# Patient Record
Sex: Female | Born: 1975 | Race: White | Hispanic: No | Marital: Married | Smoking: Never smoker
Health system: Southern US, Community
[De-identification: ages and names within clinical notes are randomized; demographics above are authoritative.]

## PROBLEM LIST (undated history)

## (undated) DIAGNOSIS — Z302 Encounter for sterilization: Secondary | ICD-10-CM

## (undated) DIAGNOSIS — Z98891 History of uterine scar from previous surgery: Secondary | ICD-10-CM

## (undated) DIAGNOSIS — O9081 Anemia of the puerperium: Secondary | ICD-10-CM

## (undated) DIAGNOSIS — B999 Unspecified infectious disease: Secondary | ICD-10-CM

## (undated) DIAGNOSIS — Z8719 Personal history of other diseases of the digestive system: Secondary | ICD-10-CM

## (undated) DIAGNOSIS — N943 Premenstrual tension syndrome: Secondary | ICD-10-CM

## (undated) DIAGNOSIS — Z8619 Personal history of other infectious and parasitic diseases: Secondary | ICD-10-CM

## (undated) DIAGNOSIS — K56609 Unspecified intestinal obstruction, unspecified as to partial versus complete obstruction: Secondary | ICD-10-CM

## (undated) DIAGNOSIS — E119 Type 2 diabetes mellitus without complications: Secondary | ICD-10-CM

## (undated) DIAGNOSIS — O24419 Gestational diabetes mellitus in pregnancy, unspecified control: Secondary | ICD-10-CM

## (undated) DIAGNOSIS — R51 Headache: Secondary | ICD-10-CM

## (undated) HISTORY — DX: Headache: R51

## (undated) HISTORY — DX: Gestational diabetes mellitus in pregnancy, unspecified control: O24.419

## (undated) HISTORY — DX: Unspecified intestinal obstruction, unspecified as to partial versus complete obstruction: K56.609

## (undated) HISTORY — DX: History of uterine scar from previous surgery: Z98.891

## (undated) HISTORY — DX: Premenstrual tension syndrome: N94.3

## (undated) HISTORY — DX: Type 2 diabetes mellitus without complications: E11.9

## (undated) HISTORY — DX: Personal history of other infectious and parasitic diseases: Z86.19

## (undated) HISTORY — DX: Personal history of other diseases of the digestive system: Z87.19

## (undated) HISTORY — PX: COLOSTOMY: SHX63

## (undated) HISTORY — DX: Unspecified infectious disease: B99.9

## (undated) HISTORY — PX: ABDOMINAL SURGERY: SHX537

## (undated) HISTORY — DX: Anemia of the puerperium: O90.81

## (undated) HISTORY — PX: WISDOM TOOTH EXTRACTION: SHX21

---

## 2004-02-06 ENCOUNTER — Other Ambulatory Visit: Admission: RE | Admit: 2004-02-06 | Discharge: 2004-02-06 | Payer: Self-pay | Admitting: Obstetrics and Gynecology

## 2004-02-06 DIAGNOSIS — Z8719 Personal history of other diseases of the digestive system: Secondary | ICD-10-CM

## 2004-02-06 HISTORY — DX: Personal history of other diseases of the digestive system: Z87.19

## 2005-04-03 ENCOUNTER — Other Ambulatory Visit: Admission: RE | Admit: 2005-04-03 | Discharge: 2005-04-03 | Payer: Self-pay | Admitting: Obstetrics and Gynecology

## 2009-10-30 ENCOUNTER — Encounter (INDEPENDENT_AMBULATORY_CARE_PROVIDER_SITE_OTHER): Payer: Self-pay | Admitting: Obstetrics and Gynecology

## 2009-10-30 ENCOUNTER — Inpatient Hospital Stay (HOSPITAL_COMMUNITY): Admission: AD | Admit: 2009-10-30 | Discharge: 2009-11-02 | Payer: Self-pay | Admitting: Obstetrics and Gynecology

## 2010-03-21 LAB — CBC
HCT: 28.2 % — ABNORMAL LOW (ref 36.0–46.0)
HCT: 36.2 % (ref 36.0–46.0)
Hemoglobin: 12.6 g/dL (ref 12.0–15.0)
Hemoglobin: 9.9 g/dL — ABNORMAL LOW (ref 12.0–15.0)
MCH: 32.5 pg (ref 26.0–34.0)
MCHC: 34.8 g/dL (ref 30.0–36.0)
MCHC: 35.2 g/dL (ref 30.0–36.0)
MCV: 94.4 fL (ref 78.0–100.0)
RDW: 12.9 % (ref 11.5–15.5)

## 2011-01-06 ENCOUNTER — Ambulatory Visit (INDEPENDENT_AMBULATORY_CARE_PROVIDER_SITE_OTHER): Payer: BC Managed Care – PPO

## 2011-01-06 DIAGNOSIS — J45909 Unspecified asthma, uncomplicated: Secondary | ICD-10-CM

## 2011-04-19 ENCOUNTER — Ambulatory Visit (INDEPENDENT_AMBULATORY_CARE_PROVIDER_SITE_OTHER): Payer: BC Managed Care – PPO | Admitting: Family Medicine

## 2011-04-19 VITALS — BP 116/72 | HR 73 | Temp 98.4°F | Resp 16 | Ht 60.5 in | Wt 132.6 lb

## 2011-04-19 DIAGNOSIS — J40 Bronchitis, not specified as acute or chronic: Secondary | ICD-10-CM

## 2011-04-19 DIAGNOSIS — J069 Acute upper respiratory infection, unspecified: Secondary | ICD-10-CM

## 2011-04-19 DIAGNOSIS — J209 Acute bronchitis, unspecified: Secondary | ICD-10-CM

## 2011-04-19 MED ORDER — AZITHROMYCIN 250 MG PO TABS: ORAL_TABLET | ORAL | Status: AC

## 2011-04-19 NOTE — Progress Notes (Signed)
36 yo woman with recurring URI sx for 6 months.  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  Has 22 month old child  O: alert, hoarse, NAD HEENT:  Red post pharynx, TM's ok Neck:  Few anterior cervical nodes Chest:  Few ronchi.  A:  URI, recurrent  P:  z pak

## 2011-04-19 NOTE — Patient Instructions (Signed)

## 2011-04-25 ENCOUNTER — Ambulatory Visit: Payer: Self-pay | Admitting: Obstetrics and Gynecology

## 2011-04-29 ENCOUNTER — Telehealth: Payer: Self-pay | Admitting: Obstetrics and Gynecology

## 2011-04-30 ENCOUNTER — Other Ambulatory Visit (INDEPENDENT_AMBULATORY_CARE_PROVIDER_SITE_OTHER): Payer: BC Managed Care – PPO

## 2011-04-30 DIAGNOSIS — N912 Amenorrhea, unspecified: Secondary | ICD-10-CM

## 2011-04-30 NOTE — Progress Notes (Unsigned)
Patient is 5 weeks 6 days. PNV samples given.

## 2011-05-08 ENCOUNTER — Ambulatory Visit (INDEPENDENT_AMBULATORY_CARE_PROVIDER_SITE_OTHER): Payer: BC Managed Care – PPO | Admitting: Obstetrics and Gynecology

## 2011-05-08 ENCOUNTER — Encounter: Payer: Self-pay | Admitting: Obstetrics and Gynecology

## 2011-05-08 VITALS — BP 104/62 | Ht 59.0 in | Wt 131.0 lb

## 2011-05-08 DIAGNOSIS — Z331 Pregnant state, incidental: Secondary | ICD-10-CM

## 2011-05-08 DIAGNOSIS — B49 Unspecified mycosis: Secondary | ICD-10-CM

## 2011-05-08 DIAGNOSIS — R519 Headache, unspecified: Secondary | ICD-10-CM | POA: Insufficient documentation

## 2011-05-08 DIAGNOSIS — B379 Candidiasis, unspecified: Secondary | ICD-10-CM | POA: Insufficient documentation

## 2011-05-08 DIAGNOSIS — Z8742 Personal history of other diseases of the female genital tract: Secondary | ICD-10-CM

## 2011-05-08 DIAGNOSIS — Z8719 Personal history of other diseases of the digestive system: Secondary | ICD-10-CM

## 2011-05-08 DIAGNOSIS — Z124 Encounter for screening for malignant neoplasm of cervix: Secondary | ICD-10-CM

## 2011-05-08 DIAGNOSIS — Z01419 Encounter for gynecological examination (general) (routine) without abnormal findings: Secondary | ICD-10-CM

## 2011-05-08 DIAGNOSIS — N943 Premenstrual tension syndrome: Secondary | ICD-10-CM

## 2011-05-08 DIAGNOSIS — D649 Anemia, unspecified: Secondary | ICD-10-CM

## 2011-05-08 DIAGNOSIS — Z8619 Personal history of other infectious and parasitic diseases: Secondary | ICD-10-CM

## 2011-05-08 DIAGNOSIS — R51 Headache: Secondary | ICD-10-CM

## 2011-05-08 NOTE — Progress Notes (Addendum)
Last Pap:2011 nl WNL: Yes Regular Periods:no  Monthly Breast exam:yes Tetanus<33yrs:yes Nl.Bladder Function:yes Daily BMs:yes Healthy Diet:yes Calcium:no Mammogram:no Exercise:yes Seatbelt: yes Abuse at home: no Stressful work:yes Sigmoid-colonoscopy: no Bone Density: No  Subjective:    Alison Reed is a 36 y.o. female, G3P1011, who presents for an annual exam. See above. Last menstrual period March 20, 2011.  Currently [redacted] weeks pregnant.  Due date is December 25, 2011.  She is doing well.  Prior Hysterectomy: No    History   Social History  . Marital Status: Single    Spouse Name: N/A    Number of Children: N/A  . Years of Education: N/A   Social History Main Topics  . Smoking status: Never Smoker   . Smokeless tobacco: Never Used  . Alcohol Use: No  . Drug Use: No  . Sexually Active: Yes -- Female partner(s)    Birth Control/ Protection: None   Other Topics Concern  . None   Social History Narrative  . None    Menstrual cycle:   LMP: Patient's last menstrual period was 03/20/2011.           Cycle: pregnant  The following portions of the patient's history were reviewed and updated as appropriate: allergies, current medications, past family history, past medical history, past social history, past surgical history and problem list.  Review of Systems Pertinent items are noted in HPI. Breast:Negative for breast lump,nipple discharge or nipple retraction Gastrointestinal: Negative for abdominal pain, change in bowel habits or rectal bleeding Urinary:negative   Objective:    BP 104/62  Ht 4\' 11"  (1.499 m)  Wt 131 lb (59.421 kg)  BMI 26.46 kg/m2  LMP 03/20/2011    Weight:  Wt Readings from Last 1 Encounters:  05/08/11 131 lb (59.421 kg)          BMI: Body mass index is 26.46 kg/(m^2).  General Appearance: Alert, appropriate appearance for age. No acute distress HEENT: Grossly normal Neck / Thyroid: Supple, no masses, nodes or  enlargement Lungs: clear to auscultation bilaterally Back: No CVA tenderness Breast Exam: No masses or nodes.No dimpling, nipple retraction or discharge. Cardiovascular: Regular rate and rhythm. S1, S2, no murmur Gastrointestinal: Soft, non-tender, no masses or organomegaly  ++++++++++++++++++++++++++++++++++++++++++++++++++++++++  Pelvic Exam: External genitalia: normal general appearance Vaginal: normal without tenderness, induration or masses Cervix: normal appearance Adnexa: normal bimanual exam Uterus: nontender, normal size Rectovaginal: not indicated  ++++++++++++++++++++++++++++++++++++++++++++++++++++++++  Lymphatic Exam: Non-palpable nodes in neck, clavicular, axillary, or inguinal regions Neurologic: Normal speech, no tremor  Psychiatric: Alert and oriented, appropriate affect.   Wet Prep:not applicable Urinalysis:not applicable UPT: Not done   Assessment:    Normal gyn exam   Overweight or obese: Yes   Pelvic relaxation: No  7 weeks' pregnant (due date December 25, 2011)   Plan:    pap smear Return for new OB exam Contraception:pregnant Prenatal vitamins    STD screen request: GC, chlamydia RPR: No.  HBsAg: No. Hepatitis C: No.  The updated Pap smear screening guidelines were discussed with the patient. The patient requested that I obtain a Pap smear: yes.  Kegel exercises discussed: No.  Proper diet and regular exercise were reviewed.  Annual mammograms recommended starting at age 39. Proper breast care was discussed.  Screening colonoscopy is recommended beginning at age 15.  Regular health maintenance was reviewed.  Sleep hygiene was discussed.  Adequate calcium and vitamin D intake was emphasized.  Mylinda Latina.D.

## 2011-05-10 LAB — PAP IG, CT-NG, RFX HPV ASCU
Chlamydia Probe Amp: NEGATIVE
GC Probe Amp: NEGATIVE

## 2011-05-16 ENCOUNTER — Ambulatory Visit (INDEPENDENT_AMBULATORY_CARE_PROVIDER_SITE_OTHER): Payer: BC Managed Care – PPO | Admitting: Physician Assistant

## 2011-05-16 ENCOUNTER — Telehealth: Payer: Self-pay | Admitting: Obstetrics and Gynecology

## 2011-05-16 VITALS — BP 107/72 | HR 69 | Temp 99.0°F | Resp 16 | Ht 60.0 in | Wt 142.0 lb

## 2011-05-16 DIAGNOSIS — J069 Acute upper respiratory infection, unspecified: Secondary | ICD-10-CM

## 2011-05-16 DIAGNOSIS — H9209 Otalgia, unspecified ear: Secondary | ICD-10-CM

## 2011-05-16 DIAGNOSIS — R05 Cough: Secondary | ICD-10-CM

## 2011-05-16 MED ORDER — IPRATROPIUM BROMIDE 0.03 % NA SOLN: 2.0000 | Freq: Two times a day (BID) | NASAL | Status: DC

## 2011-05-16 NOTE — Telephone Encounter (Signed)
Nurse pool sent

## 2011-05-16 NOTE — Patient Instructions (Signed)
Get lots of rest, and drink at least 64 ounces of water daily.  Continue the delsym as needed.  You may use acetaminophen as needed.

## 2011-05-16 NOTE — Progress Notes (Signed)
  Subjective:    Patient ID: Lisabeth Pick, female    DOB: 01-12-1975, 36 y.o.   MRN: [id number]  HPI  "Joni Reining" presents with 3 days of left ear pain and left throat pain.  Laryngitis this morning.  Cough began yesterday.  No fever, chills.  Mild nausea, mild urinary frequency. Back feels achy.  She is [redacted] weeks pregnant with her second child (daughter is 18 months).  Her adoptive father died in 2022-04-13 after a brief battle with lung cancer (he never smoked).  Review of Systems As above.     Objective:   Physical Exam  Vital signs noted. Well-developed, well nourished WF who is awake, alert and oriented, in NAD. HEENT: [place]/AT, PERRL, EOMI.  Sclera and conjunctiva are clear.  EAC are patent, TMs are normal in appearance. Nasal mucosa is pink and moist, a little congested. OP is noted for drainage posteriorly. Neck: supple, non-tender, no lymphadenopathy, thyromegaly. Heart: RRR, no murmur Lungs: CTA Extremities: no cyanosis, clubbing or edema. Skin: warm and dry without rash.      Assessment & Plan:   1. URI (upper respiratory infection)  ipratropium (ATROVENT) 0.03 % nasal spray  2. Otalgia    3. Cough     Supportive care.  Anticipatory guidance.

## 2011-05-22 ENCOUNTER — Telehealth: Payer: Self-pay | Admitting: Obstetrics and Gynecology

## 2011-05-22 NOTE — Telephone Encounter (Signed)
Spoke with pt rgd msg pt preg c/o cough time 1 week advised pt can try plain robitussin, plain mucin ex, plain benadryl pt voice understanding.

## 2011-05-22 NOTE — Telephone Encounter (Signed)
Triage/epic 

## 2011-06-04 ENCOUNTER — Ambulatory Visit (INDEPENDENT_AMBULATORY_CARE_PROVIDER_SITE_OTHER): Payer: BC Managed Care – PPO | Admitting: Obstetrics and Gynecology

## 2011-06-04 ENCOUNTER — Encounter: Payer: Self-pay | Admitting: Obstetrics and Gynecology

## 2011-06-04 VITALS — Ht 59.5 in

## 2011-06-04 VITALS — BP 90/58 | Wt 135.0 lb

## 2011-06-04 DIAGNOSIS — Z9889 Other specified postprocedural states: Secondary | ICD-10-CM

## 2011-06-04 DIAGNOSIS — Z331 Pregnant state, incidental: Secondary | ICD-10-CM

## 2011-06-04 DIAGNOSIS — Z98891 History of uterine scar from previous surgery: Secondary | ICD-10-CM

## 2011-06-04 LAB — POCT URINALYSIS DIPSTICK
Bilirubin, UA: NEGATIVE
Glucose, UA: NEGATIVE
Ketones, UA: NEGATIVE
Nitrite, UA: NEGATIVE
pH, UA: 6

## 2011-06-04 LAB — POCT WET PREP (WET MOUNT)
Bacteria Wet Prep HPF POC: NEGATIVE
WBC, Wet Prep HPF POC: NEGATIVE
pH: 4

## 2011-06-04 NOTE — Patient Instructions (Signed)
Vaginal Birth After Cesarean Delivery Vaginal birth after Cesarean delivery (VBAC) is giving birth vaginally after previously delivering a baby by a cesarean. In the past, if a woman had a Cesarean delivery, all births afterwards would be done by Cesarean delivery. This is no longer true. It can be safe for the mother to try a vaginal delivery after having a Cesarean. The final decision to have a VBAC or repeat Cesarean delivery should be between the patient and her caregiver. The risks and benefits can be discussed relative to the reason for, and the type of the previous Cesarean delivery. WOMEN WHO PLAN TO HAVE A VBAC SHOULD CHECK WITH THEIR DOCTOR TO BE SURE THAT:  The previous Cesarean was done with a low transverse uterine incision (not a vertical classical incision).   The birth canal is big enough for the baby.   There were no other operations on the uterus.   They will have an electronic fetal monitor (EFM) on at all times during labor.   An operating room would be available and ready in case an emergency Cesarean is needed.   A doctor and surgical nursing staff would be available at all times during labor to be ready to do an emergency Cesarean if necessary.   An anesthesiologist would be present in case an emergency Cesarean is needed.   The nursery is prepared and has adequate personnel and necessary equipment available to care for the baby in case of an emergency Cesarean.  BENEFITS OF VBAC:  Shorter stay in the hospital.   Lower delivery, nursery and hospital costs.   Less blood loss and need for blood transfusions.   Less fever and discomfort from major surgery.   Lower risk of blood clots.   Lower risk of infection.   Shorter recovery after going home.   Lower risk of other surgical complications, such as opening of the incision or hernia in the incision.   Decreased risk of injury to other organs.   Decreased risk for having to remove the uterus (hysterectomy).     Decreased risk for the placenta to completely or partially cover the opening of the uterus (placenta previa) with a future pregnancy.   Ability to have a larger family if desired.  RISKS OF A VBAC:  Rupture of the uterus.   Having to remove the uterus (hysterectomy) if it ruptures.   All the complications of major surgery and/or injury to other organs.   Excessive bleeding, blood clots and infection.   Lower Apgar scores (method to evaluate the newborn based on appearance, pulse, grimace, activity, and respiration) and more risks to the baby.   There is a higher risk of uterine rupture if you induce or augment labor.   There is a higher risk of uterine rupture if you use medications to ripen the cervix.  VBAC SHOULD NOT BE DONE IF:  The previous Cesarean was done with a vertical (classical) or T-shaped incision, or you do not know what kind of an incision was made.   You had a ruptured uterus.   You had surgery on your uterus.   You have medical or obstetrical problems.   There are problems with the baby.   There were two previous Cesarean deliveries and no vaginal deliveries.  OTHER FACTS TO KNOW ABOUT VBAC:  It is safe to have an epidural anesthetic with VBAC.   It is safe to turn the baby from a breech position (attempt an external cephalic version).   It is  safe to try a VBAC with twins.   Pregnancies later than 40 weeks have not been successful with VBAC.   There is an increased failure rate of a VBAC in obese pregnant women.   There is an increased failure rate with VABC if the baby weighs 8.8 pounds (4000 grams) or more.   There is an increased failure rate if the time between the Cesarean and VBAC is less than 19 months.   There is an increased failure rate if pre-eclampsia is present (high blood pressure, protein in the urine and swelling of face and extremities).   VBAC is very successful if there was a previous vaginal birth.   VBAC is very successful  when the labor starts spontaneously before the due date.   Delivery of VBAC is similar to having a normal spontaneous vaginal delivery.  It is important to discuss VBAC with your caregiver early in the pregnancy so you can understand the risks, benefits and options. It will give you time to decide what is best in your particular case relevant to the reason for your previous Cesarean delivery. It should be understood that medical changes in the mother or pregnancy may occur during the pregnancy, which make it necessary to change you or your caregiver's initial decision. The counseling, concerns and decisions should be documented in the medical record and signed by all parties. Document Released: 06/16/2006 Document Revised: 12/13/2010 Document Reviewed: 02/05/2008 [facility] Patient Information 2012 [place], [place].ABCs of Pregnancy A Antepartum care is very important. Be sure you see your doctor and get prenatal care as soon as you think you are pregnant. At this time, you will be tested for infection, genetic abnormalities and potential problems with you and the pregnancy. This is the time to discuss diet, exercise, work, medications, labor, pain medication during labor and the possibility of a cesarean delivery. Ask any questions that may concern you. It is important to see your doctor regularly throughout your pregnancy. Avoid exposure to toxic substances and chemicals - such as cleaning solvents, lead and mercury, some insecticides, and paint. Pregnant women should avoid exposure to paint fumes, and fumes that cause you to feel ill, dizzy or faint. When possible, it is a good idea to have a pre-pregnancy consultation with your caregiver to begin some important recommendations your caregiver suggests such as, taking folic acid, exercising, quitting smoking, avoiding alcoholic beverages, etc. B Breastfeeding is the healthiest choice for both you and your baby. It has many nutritional benefits for the baby  and health benefits for the mother. It also creates a very tight and loving bond between the baby and mother. Talk to your doctor, your family and friends, and your employer about how you choose to feed your baby and how they can support you in your decision. Not all birth defects can be prevented, but a woman can take actions that may increase her chance of having a healthy baby. Many birth defects happen very early in pregnancy, sometimes before a woman even knows she is pregnant. Birth defects or abnormalities of any child in your or the father's family should be discussed with your caregiver. Get a good support bra as your breast size changes. Wear it especially when you exercise and when nursing.  C Celebrate the news of your pregnancy with the your spouse/father and family. Childbirth classes are helpful to take for you and the spouse/father because it helps to understand what happens during the pregnancy, labor and delivery. Cesarean delivery should be discussed with  your doctor so you are prepared for that possibility. The pros and cons of circumcision if it is a boy, should be discussed with your pediatrician. Cigarette smoking during pregnancy can result in low birth weight babies. It has been associated with infertility, miscarriages, tubal pregnancies, infant death (mortality) and poor health (morbidity) in childhood. Additionally, cigarette smoking may cause long-term learning disabilities. If you smoke, you should try to quit before getting pregnant and not smoke during the pregnancy. Secondary smoke may also harm a mother and her developing baby. It is a good idea to ask people to stop smoking around you during your pregnancy and after the baby is born. Extra calcium is necessary when you are pregnant and is found in your prenatal vitamin, in dairy products, green leafy vegetables and in calcium supplements. D A healthy diet according to your current weight and height, along with vitamins and  mineral supplements should be discussed with your caregiver. Domestic abuse or violence should be made known to your doctor right away to get the situation corrected. Drink more water when you exercise to keep hydrated. Discomfort of your back and legs usually develops and progresses from the middle of the second trimester through to delivery of the baby. This is because of the enlarging baby and uterus, which may also affect your balance. Do not take illegal drugs. Illegal drugs can seriously harm the baby and you. Drink extra fluids (water is best) throughout pregnancy to help your body keep up with the increases in your blood volume. Drink at least 6 to 8 glasses of water, fruit juice, or milk each day. A good way to know you are drinking enough fluid is when your urine looks almost like clear water or is very light yellow.  E Eat healthy to get the nutrients you and your unborn baby need. Your meals should include the five basic food groups. Exercise (30 minutes of light to moderate exercise a day) is important and encouraged during pregnancy, if there are no medical problems or problems with the pregnancy. Exercise that causes discomfort or dizziness should be stopped and reported to your caregiver. Emotions during pregnancy can change from being ecstatic to depression and should be understood by you, your partner and your family. F Fetal screening with ultrasound, amniocentesis and monitoring during pregnancy and labor is common and sometimes necessary. Take 400 micrograms of folic acid daily both before, when possible, and during the first few months of pregnancy to reduce the risk of birth defects of the brain and spine. All women who could possibly become pregnant should take a vitamin with folic acid, every day. It is also important to eat a healthy diet with fortified foods (enriched grain products, including cereals, rice, breads, and pastas) and foods with natural sources of folate (orange juice,  green leafy vegetables, beans, peanuts, broccoli, asparagus, peas, and lentils). The father should be involved with all aspects of the pregnancy including, the prenatal care, childbirth classes, labor, delivery, and postpartum time. Fathers may also have emotional concerns about being a father, financial needs, and raising a family. G Genetic testing should be done appropriately. It is important to know your family and the father's history. If there have been problems with pregnancies or birth defects in your family, report these to your doctor. Also, genetic counselors can talk with you about the information you might need in making decisions about having a family. You can call a major medical center in your area for help in finding a  board-certified genetic counselor. Genetic testing and counseling should be done before pregnancy when possible, especially if there is a history of problems in the mother's or father's family. Certain ethnic backgrounds are more at risk for genetic defects. H Get familiar with the hospital where you will be having your baby. Get to know how long it takes to get there, the labor and delivery area, and the hospital procedures. Be sure your medical insurance is accepted there. Get your home ready for the baby including, clothes, the baby's room (when possible), furniture and car seat. Hand washing is important throughout the day, especially after handling raw meat and poultry, changing the baby's diaper or using the bathroom. This can help prevent the spread of many bacteria and viruses that cause infection. Your hair may become dry and thinner, but will return to normal a few weeks after the baby is born. Heartburn is a common problem that can be treated by taking antacids recommended by your caregiver, eating smaller meals 5 or 6 times a day, not drinking liquids when eating, drinking between meals and raising the head of your bed 2 to 3 inches. I Insurance to cover you, the  baby, doctor and hospital should be reviewed so that you will be prepared to pay any costs not covered by your insurance plan. If you do not have medical insurance, there are usually clinics and services available for you in your community. Take 30 milligrams of iron during your pregnancy as prescribed by your doctor to reduce the risk of low red blood cells (anemia) later in pregnancy. All women of childbearing age should eat a diet rich in iron. J There should be a joint effort for the mother, father and any other children to adapt to the pregnancy financially, emotionally, and psychologically during the pregnancy. Join a support group for moms-to-be. Or, join a class on parenting or childbirth. Have the family participate when possible. K Know your limits. Let your caregiver know if you experience any of the following:   Pain of any kind.   Strong cramps.   You develop a lot of weight in a short period of time (5 pounds in 3 to 5 days).   Vaginal bleeding, leaking of amniotic fluid.   Headache, vision problems.   Dizziness, fainting, shortness of breath.   Chest pain.   Fever of 102 F (38.9 C) or higher.   Gush of clear fluid from your vagina.   Painful urination.   Domestic violence.   Irregular heartbeat (palpitations).   Rapid beating of the heart (tachycardia).   Constant feeling sick to your stomach (nauseous) and vomiting.   Trouble walking, fluid retention (edema).   Muscle weakness.   If your baby has decreased activity.   Persistent diarrhea.   Abnormal vaginal discharge.   Uterine contractions at 20-minute intervals.   Back pain that travels down your leg.  L Learn and practice that what you eat and drink should be in moderation and healthy for you and your baby. Legal drugs such as alcohol and caffeine are important issues for pregnant women. There is no safe amount of alcohol a woman can drink while pregnant. Fetal alcohol syndrome, a disorder  characterized by growth retardation, facial abnormalities, and central nervous system dysfunction, is caused by a woman's use of alcohol during pregnancy. Caffeine, found in tea, coffee, soft drinks and chocolate, should also be limited. Be sure to read labels when trying to cut down on caffeine during pregnancy. More than 200  foods, beverages, and over-the-counter medications contain caffeine and have a high salt content! There are coffees and teas that do not contain caffeine. M Medical conditions such as diabetes, epilepsy, and high blood pressure should be treated and kept under control before pregnancy when possible, but especially during pregnancy. Ask your caregiver about any medications that may need to be changed or adjusted during pregnancy. If you are currently taking any medications, ask your caregiver if it is safe to take them while you are pregnant or before getting pregnant when possible. Also, be sure to discuss any herbs or vitamins you are taking. They are medicines, too! Discuss with your doctor all medications, prescribed and over-the-counter, that you are taking. During your prenatal visit, discuss the medications your doctor may give you during labor and delivery. N Never be afraid to ask your doctor or caregiver questions about your health, the progress of the pregnancy, family problems, stressful situations, and recommendation for a pediatrician, if you do not have one. It is better to take all precautions and discuss any questions or concerns you may have during your office visits. It is a good idea to write down your questions before you visit the doctor. O Over-the-counter cough and cold remedies may contain alcohol or other ingredients that should be avoided during pregnancy. Ask your caregiver about prescription, herbs or over-the-counter medications that you are taking or may consider taking while pregnant.  P Physical activity during pregnancy can benefit both you and your  baby by lessening discomfort and fatigue, providing a sense of well-being, and increasing the likelihood of early recovery after delivery. Light to moderate exercise during pregnancy strengthens the belly (abdominal) and back muscles. This helps improve posture. Practicing yoga, walking, swimming, and cycling on a stationary bicycle are usually safe exercises for pregnant women. Avoid scuba diving, exercise at high altitudes (over 3000 feet), skiing, horseback riding, contact sports, etc. Always check with your doctor before beginning any kind of exercise, especially during pregnancy and especially if you did not exercise before getting pregnant. Q Queasiness, stomach upset and morning sickness are common during pregnancy. Eating a couple of crackers or dry toast before getting out of bed. Foods that you normally love may make you feel sick to your stomach. You may need to substitute other nutritious foods. Eating 5 or 6 small meals a day instead of 3 large ones may make you feel better. Do not drink with your meals, drink between meals. Questions that you have should be written down and asked during your prenatal visits. R Read about and make plans to baby-proof your home. There are important tips for making your home a safer environment for your baby. Review the tips and make your home safer for you and your baby. Read food labels regarding calories, salt and fat content in the food. S Saunas, hot tubs, and steam rooms should be avoided while you are pregnant. Excessive high heat may be harmful during your pregnancy. Your caregiver will screen and examine you for sexually transmitted diseases and genetic disorders during your prenatal visits. Learn the signs of labor. Sexual relations while pregnant is safe unless there is a medical or pregnancy problem and your caregiver advises against it. T Traveling long distances should be avoided especially in the third trimester of your pregnancy. If you do have to  travel out of state, be sure to take a copy of your medical records and medical insurance plan with you. You should not travel long distances without seeing  your doctor first. Most airlines will not allow you to travel after 36 weeks of pregnancy. Toxoplasmosis is an infection caused by a parasite that can seriously harm an unborn baby. Avoid eating undercooked meat and handling cat litter. Be sure to wear gloves when gardening. Tingling of the hands and fingers is not unusual and is due to fluid retention. This will go away after the baby is born. U Womb (uterus) size increases during the first trimester. Your kidneys will begin to function more efficiently. This may cause you to feel the need to urinate more often. You may also leak urine when sneezing, coughing or laughing. This is due to the growing uterus pressing against your bladder, which lies directly in front of and slightly under the uterus during the first few months of pregnancy. If you experience burning along with frequency of urination or bloody urine, be sure to tell your doctor. The size of your uterus in the third trimester may cause a problem with your balance. It is advisable to maintain good posture and avoid wearing high heels during this time. An ultrasound of your baby may be necessary during your pregnancy and is safe for you and your baby. V Vaccinations are an important concern for pregnant women. Get needed vaccines before pregnancy. Center for Disease Control ([employer]) has clear guidelines for the use of vaccines during pregnancy. Review the list, be sure to discuss it with your doctor. Prenatal vitamins are helpful and healthy for you and the baby. Do not take extra vitamins except what is recommended. Taking too much of certain vitamins can cause overdose problems. Continuous vomiting should be reported to your caregiver. Varicose veins may appear especially if there is a family history of varicose veins. They should subside  after the delivery of the baby. Support hose helps if there is leg discomfort. W Being overweight or underweight during pregnancy may cause problems. Try to get within 15 pounds of your ideal weight before pregnancy. Remember, pregnancy is not a time to be dieting! Do not stop eating or start skipping meals as your weight increases. Both you and your baby need the calories and nutrition you receive from a healthy diet. Be sure to consult with your doctor about your diet. There is a formula and diet plan available depending on whether you are overweight or underweight. Your caregiver or nutritionist can help and advise you if necessary. X Avoid X-rays. If you must have dental work or diagnostic tests, tell your dentist or physician that you are pregnant so that extra care can be taken. X-rays should only be taken when the risks of not taking them outweigh the risk of taking them. If needed, only the minimum amount of radiation should be used. When X-rays are necessary, protective lead shields should be used to cover areas of the body that are not being X-rayed. Y Your baby loves you. Breastfeeding your baby creates a loving and very close bond between the two of you. Give your baby a healthy environment to live in while you are pregnant. Infants and children require constant care and guidance. Their health and safety should be carefully watched at all times. After the baby is born, rest or take a nap when the baby is sleeping. Z Get your ZZZs. Be sure to get plenty of rest. Resting on your side as often as possible, especially on your left side is advised. It provides the best circulation to your baby and helps reduce swelling. Try taking a nap for  30 to 45 minutes in the afternoon when possible. After the baby is born rest or take a nap when the baby is sleeping. Try elevating your feet for that amount of time when possible. It helps the circulation in your legs and helps reduce swelling.  Most information  courtesy of the CDC. Document Released: 12/24/2004 Document Revised: 12/13/2010 Document Reviewed: 09/07/2008 [facility] Patient Information 2012 [place], [place].

## 2011-06-04 NOTE — Progress Notes (Signed)
Pap & GC/CT done 05/08/2011 Pt wants genetic screenings

## 2011-06-05 LAB — PRENATAL PANEL VII
Basophils Absolute: 0 10*3/uL (ref 0.0–0.1)
Basophils Relative: 0 % (ref 0–1)
HCT: 37.6 % (ref 36.0–46.0)
HIV: NONREACTIVE
Hemoglobin: 12.9 g/dL (ref 12.0–15.0)
Hepatitis B Surface Ag: NEGATIVE
Lymphocytes Relative: 21 % (ref 12–46)
Monocytes Absolute: 0.7 10*3/uL (ref 0.1–1.0)
Neutro Abs: 5.6 10*3/uL (ref 1.7–7.7)
Neutrophils Relative %: 71 % (ref 43–77)
RDW: 13.9 % (ref 11.5–15.5)
Rh Type: POSITIVE
WBC: 8 10*3/uL (ref 4.0–10.5)

## 2011-06-06 LAB — CULTURE, OB URINE

## 2011-06-10 DIAGNOSIS — Z98891 History of uterine scar from previous surgery: Secondary | ICD-10-CM | POA: Insufficient documentation

## 2011-06-10 HISTORY — DX: History of uterine scar from previous surgery: Z98.891

## 2011-06-13 NOTE — Progress Notes (Signed)
  Subjective:    Alison Reed is being seen today for her first obstetrical visit.  This is a planned pregnancy. She is at [redacted]w[redacted]d gestation. Her obstetrical history is significant for hx prev c/s at 36wks. Relationship with FOB: spouse, living together. Patient does intend to breast feed. Pregnancy history fully reviewed.  Patient reports no complaints.  Review of Systems:   Review of Systems  All other systems reviewed and are negative.    Objective:     BP 90/58  Wt 135 lb (61.236 kg)  LMP 03/20/2011  Breastfeeding? Unknown Physical Exam  Nursing note and vitals reviewed. Constitutional: She is oriented to person, place, and time. She appears well-developed and well-nourished.  HENT:  Head: Normocephalic and atraumatic.  Eyes: Pupils are equal, round, and reactive to light.  Neck: Normal range of motion. Neck supple. No thyromegaly present.  Cardiovascular: Normal rate, regular rhythm and normal heart sounds.   Respiratory: Effort normal and breath sounds normal.  GI: Soft. Bowel sounds are normal. She exhibits no distension.  Genitourinary: Vagina normal. No vaginal discharge found.       cx - cl/th/high approx 10wk size  Musculoskeletal: Normal range of motion. She exhibits no edema.  Neurological: She is alert and oriented to person, place, and time.  Skin: Skin is warm and dry.  Psychiatric: She has a normal mood and affect. Her behavior is normal.    Maternal Exam:  Abdomen: Patient reports no abdominal tenderness. Fundal height is approx 10-11wks.    Introitus: Normal vulva. Normal vagina.  Vagina is negative for discharge.  Pelvis: adequate for delivery.   Cervix: Cervix evaluated by sterile speculum exam and digital exam.     Fetal Exam Fetal Monitor Review: Mode: hand-held doppler probe.   Baseline rate: 181.         Assessment:    Pregnancy: [id number] Patient Active Problem List  Diagnoses  . Anemia  . History of rectal bleeding  . Hx of  dysmenorrhea  . Pregnant state, incidental  . History of cesarean delivery - 36wks, in labor, breech - w [facility]    rv'd VBAC, pt may want to plan repeat C/S.    Plan:     Initial labs drawn. And rv'd, bl type O pos, urine cx neg Prenatal vitamins. Problem list reviewed and updated. Pt desires genetic screens, will plan 1st trimester in 1-2wks and AFP at [facility] Pap/GC/CT  was normal in May Wet prep neg today RTO 1-2 wks for 1st trim screen and 4wks for ROB  Hildred Pharo M 06/13/2011

## 2011-06-17 ENCOUNTER — Encounter: Payer: Self-pay | Admitting: Obstetrics and Gynecology

## 2011-06-18 ENCOUNTER — Other Ambulatory Visit: Payer: BC Managed Care – PPO

## 2011-06-18 ENCOUNTER — Ambulatory Visit (INDEPENDENT_AMBULATORY_CARE_PROVIDER_SITE_OTHER): Payer: BC Managed Care – PPO

## 2011-06-18 DIAGNOSIS — Z3689 Encounter for other specified antenatal screening: Secondary | ICD-10-CM

## 2011-06-18 DIAGNOSIS — Z36 Encounter for antenatal screening of mother: Secondary | ICD-10-CM

## 2011-06-18 DIAGNOSIS — Z331 Pregnant state, incidental: Secondary | ICD-10-CM

## 2011-06-25 ENCOUNTER — Other Ambulatory Visit: Payer: Self-pay | Admitting: Obstetrics and Gynecology

## 2011-06-25 DIAGNOSIS — Z331 Pregnant state, incidental: Secondary | ICD-10-CM

## 2011-06-25 LAB — US OB COMP LESS 14 WKS

## 2011-07-02 ENCOUNTER — Ambulatory Visit (INDEPENDENT_AMBULATORY_CARE_PROVIDER_SITE_OTHER): Payer: BC Managed Care – PPO | Admitting: Obstetrics and Gynecology

## 2011-07-02 VITALS — BP 96/60 | Wt 139.0 lb

## 2011-07-02 DIAGNOSIS — Z331 Pregnant state, incidental: Secondary | ICD-10-CM

## 2011-07-02 DIAGNOSIS — Z3689 Encounter for other specified antenatal screening: Secondary | ICD-10-CM

## 2011-07-02 NOTE — Progress Notes (Signed)
Doing well. Patient will learn to sleep on her side. Ultrasound of anatomy next visit. Alpha-fetoprotein screen next visit. Return office in 4 weeks. Dr. Stefano Gaul

## 2011-07-02 NOTE — Progress Notes (Signed)
Pt has a concern about 36yr old kicking her belly,  pt states sometimes she sleeps on her belly.

## 2011-07-30 ENCOUNTER — Encounter: Payer: Self-pay | Admitting: Obstetrics and Gynecology

## 2011-07-30 ENCOUNTER — Ambulatory Visit (INDEPENDENT_AMBULATORY_CARE_PROVIDER_SITE_OTHER): Payer: BC Managed Care – PPO

## 2011-07-30 ENCOUNTER — Ambulatory Visit (INDEPENDENT_AMBULATORY_CARE_PROVIDER_SITE_OTHER): Payer: BC Managed Care – PPO | Admitting: Obstetrics and Gynecology

## 2011-07-30 VITALS — BP 104/62 | Wt 141.0 lb

## 2011-07-30 DIAGNOSIS — Z98891 History of uterine scar from previous surgery: Secondary | ICD-10-CM

## 2011-07-30 DIAGNOSIS — Z331 Pregnant state, incidental: Secondary | ICD-10-CM

## 2011-07-30 DIAGNOSIS — Z3689 Encounter for other specified antenatal screening: Secondary | ICD-10-CM

## 2011-07-30 DIAGNOSIS — Z9889 Other specified postprocedural states: Secondary | ICD-10-CM

## 2011-07-30 NOTE — Progress Notes (Signed)
AFP today. No problems.  Sono:   EFW: 8oz +/-0.67oz  AUA 54th%tile   CX: 3.29cm   Vertex presentation  Posterior placenta.  Normal fluid:  AP pocket = 4cm   Growth/EFW = 27th%tile by LMP date  By first u/s measurements, GA = [redacted]w[redacted]d.  See 1st tri scrieen report. todays measurements are c/w   First u/s . ? Need to change EDD.    No fetal abnormality is seen.  Fetal spine not seen due to fetal position. Suggest f/u in 2 weeks to   Clear and evaluate linear growth.  Open hands, 5th digit seen.  Female gender.   CX closed. Normal adnexa. EDC: Changed to be consistent with 1st trimester screen ultrasound and today's ultrasound  01/03/12 VBAC consent given

## 2011-07-31 LAB — ALPHA FETOPROTEIN, MATERNAL
AFP: 42.4 IU/mL
Curr Gest Age: 18.6 wks.days
MoM for AFP: 0.97

## 2011-08-01 LAB — US OB COMP + 14 WK

## 2011-08-27 ENCOUNTER — Ambulatory Visit (INDEPENDENT_AMBULATORY_CARE_PROVIDER_SITE_OTHER): Payer: BC Managed Care – PPO | Admitting: Obstetrics and Gynecology

## 2011-08-27 ENCOUNTER — Ambulatory Visit (INDEPENDENT_AMBULATORY_CARE_PROVIDER_SITE_OTHER): Payer: BC Managed Care – PPO

## 2011-08-27 ENCOUNTER — Encounter: Payer: Self-pay | Admitting: Obstetrics and Gynecology

## 2011-08-27 ENCOUNTER — Other Ambulatory Visit: Payer: Self-pay | Admitting: Obstetrics and Gynecology

## 2011-08-27 VITALS — BP 90/60 | Wt 144.0 lb

## 2011-08-27 DIAGNOSIS — Z3490 Encounter for supervision of normal pregnancy, unspecified, unspecified trimester: Secondary | ICD-10-CM

## 2011-08-27 DIAGNOSIS — Z331 Pregnant state, incidental: Secondary | ICD-10-CM

## 2011-08-27 DIAGNOSIS — O358XX0 Maternal care for other (suspected) fetal abnormality and damage, not applicable or unspecified: Secondary | ICD-10-CM

## 2011-08-27 LAB — US OB FOLLOW UP

## 2011-08-27 NOTE — Progress Notes (Signed)
Doing well, except for heart burn. Comfort measures and OTC meds reviewed. [place] today--completion of spine anatomy, cervix 4.12.  Normal fluid and growth.  Breech, posterior placenta. Glucola at 28-29 weeks. Open to VBAC, but if C/S needed, patient has no concerns about that.

## 2011-08-27 NOTE — Progress Notes (Signed)
C/o frequent heart burns  07/30/11 AFP WNL

## 2011-09-24 ENCOUNTER — Ambulatory Visit (INDEPENDENT_AMBULATORY_CARE_PROVIDER_SITE_OTHER): Payer: BC Managed Care – PPO | Admitting: Obstetrics and Gynecology

## 2011-09-24 ENCOUNTER — Encounter: Payer: Self-pay | Admitting: Obstetrics and Gynecology

## 2011-09-24 VITALS — BP 104/62 | Wt 147.0 lb

## 2011-09-24 DIAGNOSIS — Z331 Pregnant state, incidental: Secondary | ICD-10-CM

## 2011-09-24 NOTE — Progress Notes (Signed)
Pt stated no issues today.  

## 2011-09-24 NOTE — Progress Notes (Signed)
[redacted]w[redacted]d   GFM Previous cesarean for Breech: desires VBAC. Consent given VBAC calculator: 67 % success

## 2011-10-08 ENCOUNTER — Ambulatory Visit (INDEPENDENT_AMBULATORY_CARE_PROVIDER_SITE_OTHER): Payer: BC Managed Care – PPO | Admitting: Obstetrics and Gynecology

## 2011-10-08 ENCOUNTER — Encounter: Payer: Self-pay | Admitting: Obstetrics and Gynecology

## 2011-10-08 ENCOUNTER — Other Ambulatory Visit: Payer: BC Managed Care – PPO

## 2011-10-08 VITALS — BP 90/60 | Wt 148.0 lb

## 2011-10-08 DIAGNOSIS — Z331 Pregnant state, incidental: Secondary | ICD-10-CM

## 2011-10-08 LAB — HEMOGLOBIN: Hemoglobin: 12 g/dL (ref 12.0–15.0)

## 2011-10-08 MED ORDER — PANTOPRAZOLE SODIUM 20 MG PO TBEC: 20.0000 mg | DELAYED_RELEASE_TABLET | Freq: Every day | ORAL | Status: DC

## 2011-10-08 NOTE — Progress Notes (Signed)
Patient ID: Alison Reed, female   DOB: 30-Oct-1975, 36 y.o.   MRN: [id number] [redacted]w[redacted]d 1 gtt today O pos Reviewed s/s preterm labor, srom, vag bleeding,daily kick counts to report, encouraged 8 water daily and frequent voids. Lavera Guise, CNM

## 2011-10-08 NOTE — Addendum Note (Signed)
Addended by: Loralyn Freshwater on: 10/08/2011 03:17 PM   Modules accepted: Orders

## 2011-10-08 NOTE — Progress Notes (Signed)
Glucola given today, pt states that pepcid complete is no longer helping.

## 2011-10-09 ENCOUNTER — Other Ambulatory Visit: Payer: Self-pay | Admitting: Obstetrics and Gynecology

## 2011-10-09 ENCOUNTER — Telehealth: Payer: Self-pay | Admitting: Obstetrics and Gynecology

## 2011-10-09 DIAGNOSIS — O24419 Gestational diabetes mellitus in pregnancy, unspecified control: Secondary | ICD-10-CM

## 2011-10-09 DIAGNOSIS — O99810 Abnormal glucose complicating pregnancy: Secondary | ICD-10-CM

## 2011-10-09 LAB — GLUCOSE TOLERANCE, 1 HOUR (50G) W/O FASTING: Glucose, 1 Hour GTT: 151 mg/dL — ABNORMAL HIGH (ref 70–140)

## 2011-10-09 NOTE — Telephone Encounter (Signed)
Notified pt of elevated 1 hr GTT.  Sched pt 3 hr GTT on 10-18-2011 @ 8:00.  Mailed pt diet and appt info.

## 2011-10-19 LAB — GLUCOSE TOLERANCE, 3 HOURS
Glucose Tolerance, 1 hour: 174 mg/dL (ref 70–189)
Glucose Tolerance, Fasting: 80 mg/dL (ref 70–104)
Glucose, GTT - 3 Hour: 155 mg/dL — ABNORMAL HIGH (ref 70–144)

## 2011-10-21 DIAGNOSIS — O24419 Gestational diabetes mellitus in pregnancy, unspecified control: Secondary | ICD-10-CM | POA: Insufficient documentation

## 2011-10-21 HISTORY — DX: Gestational diabetes mellitus in pregnancy, unspecified control: O24.419

## 2011-10-22 ENCOUNTER — Encounter: Payer: Self-pay | Admitting: Obstetrics and Gynecology

## 2011-10-22 ENCOUNTER — Ambulatory Visit (INDEPENDENT_AMBULATORY_CARE_PROVIDER_SITE_OTHER): Payer: BC Managed Care – PPO | Admitting: Obstetrics and Gynecology

## 2011-10-22 ENCOUNTER — Encounter: Payer: BC Managed Care – PPO | Admitting: Obstetrics and Gynecology

## 2011-10-22 VITALS — BP 102/62 | Wt 151.0 lb

## 2011-10-22 DIAGNOSIS — O24419 Gestational diabetes mellitus in pregnancy, unspecified control: Secondary | ICD-10-CM

## 2011-10-22 DIAGNOSIS — O99810 Abnormal glucose complicating pregnancy: Secondary | ICD-10-CM

## 2011-10-22 NOTE — Progress Notes (Addendum)
[redacted]w[redacted]d 3 hour GTT equals 80/174/167/155.  Gestational diabetes. Hemoglobin 12.0.  RPR nonreactive. We'll make an appointment for the patient to see the diabetes counselor and began glucose monitoring. Patient wants a vaginal birth after cesarean section.  If a cesarean section is required, then she wants a bilateral tubal ligation.  Otherwise she will plan a vasectomy. Return to office in 2 weeks. Dr. Stefano Gaul

## 2011-10-22 NOTE — Progress Notes (Signed)
[redacted]w[redacted]d Pt wanting to speak about possible tubal ligation.

## 2011-10-23 ENCOUNTER — Telehealth: Payer: Self-pay | Admitting: Obstetrics and Gynecology

## 2011-10-23 ENCOUNTER — Encounter: Payer: BC Managed Care – PPO | Admitting: Obstetrics and Gynecology

## 2011-10-23 ENCOUNTER — Other Ambulatory Visit: Payer: Self-pay | Admitting: Obstetrics and Gynecology

## 2011-10-23 DIAGNOSIS — O99810 Abnormal glucose complicating pregnancy: Secondary | ICD-10-CM

## 2011-10-23 NOTE — Telephone Encounter (Signed)
TC to [facility].  States have already left message for pt to schedule appt.

## 2011-10-23 NOTE — Telephone Encounter (Signed)
Message copied by Mason Jim on Wed Oct 23, 2011  9:36 AM ------      Message from: Janine Limbo      Created: Tue Oct 22, 2011  8:12 PM      Regarding: diabetes consult       The patient has gestational diabetes.  Please make an appointment for the patient to see the diabetes counselor and began glucose testing.            Thank you.            Dr. Stefano Gaul

## 2011-10-30 ENCOUNTER — Encounter: Payer: BC Managed Care – PPO | Attending: Obstetrics and Gynecology | Admitting: Dietician

## 2011-10-30 DIAGNOSIS — O99810 Abnormal glucose complicating pregnancy: Secondary | ICD-10-CM | POA: Insufficient documentation

## 2011-10-30 DIAGNOSIS — Z713 Dietary counseling and surveillance: Secondary | ICD-10-CM | POA: Insufficient documentation

## 2011-10-30 DIAGNOSIS — O24419 Gestational diabetes mellitus in pregnancy, unspecified control: Secondary | ICD-10-CM

## 2011-10-31 ENCOUNTER — Telehealth: Payer: Self-pay | Admitting: Obstetrics and Gynecology

## 2011-10-31 ENCOUNTER — Other Ambulatory Visit: Payer: Self-pay | Admitting: Obstetrics and Gynecology

## 2011-10-31 ENCOUNTER — Encounter: Payer: Self-pay | Admitting: Dietician

## 2011-10-31 NOTE — Progress Notes (Signed)
  Patient was seen on 10/30/2011 for Gestational Diabetes self-management class at the [facility]. The following learning objectives were met by the patient during this course:   States the definition of Gestational Diabetes  States why dietary management is important in controlling blood glucose  Describes the effects each nutrient has on blood glucose levels  Demonstrates ability to create a balanced meal plan  Demonstrates carbohydrate counting   States when to check blood glucose levels  Demonstrates proper blood glucose monitoring techniques  States the effect of stress and exercise on blood glucose levels  States the importance of limiting caffeine and abstaining from alcohol and smoking  Blood glucose monitor given: One Touch Ultra Mini Lot # [id number] X Exp: 02/2012 Blood glucose reading: 73 at 6:20 PM  Patient instructed to monitor glucose levels: FBS: 60 - <90 2 hour: <120  Patient received handouts:  Nutrition Diabetes and Pregnancy  Carbohydrate Counting List  Patient will be seen for follow-up as needed.

## 2011-10-31 NOTE — Telephone Encounter (Signed)
Tc to pt regarding msg.  Pt has One Touch Ultra Mini glucose monitor, pt requests One Touch Ultra glucose strips check CBG's QID #100 Rf thru 12/13, One touch delica lancets check CBG's QID #100 RF thru 12/13, called to [facility] pharmacy [phone]), lm on pharmacy voice mail.  Pt called and made aware rx's called in.

## 2011-11-05 ENCOUNTER — Encounter: Payer: Self-pay | Admitting: Obstetrics and Gynecology

## 2011-11-05 ENCOUNTER — Ambulatory Visit (INDEPENDENT_AMBULATORY_CARE_PROVIDER_SITE_OTHER): Payer: BC Managed Care – PPO | Admitting: Obstetrics and Gynecology

## 2011-11-05 VITALS — BP 102/64 | Wt 152.0 lb

## 2011-11-05 DIAGNOSIS — O99810 Abnormal glucose complicating pregnancy: Secondary | ICD-10-CM

## 2011-11-05 DIAGNOSIS — O24419 Gestational diabetes mellitus in pregnancy, unspecified control: Secondary | ICD-10-CM

## 2011-11-05 DIAGNOSIS — Z23 Encounter for immunization: Secondary | ICD-10-CM

## 2011-11-05 NOTE — Progress Notes (Signed)
Pt doing well.  BS are controlled [place] @ [place] for growth FKC reviewed

## 2011-11-05 NOTE — Patient Instructions (Signed)
Fetal Movement Counts Patient Name: __________________________________________________ Patient Due Date: ____________________ Kick counts is highly recommended in high risk pregnancies, but it is a good idea for every pregnant woman to do. Start counting fetal movements at 28 weeks of the pregnancy. Fetal movements increase after eating a full meal or eating or drinking something sweet (the blood sugar is higher). It is also important to drink plenty of fluids (well hydrated) before doing the count. Lie on your left side because it helps with the circulation or you can sit in a comfortable chair with your arms over your belly (abdomen) with no distractions around you. DOING THE COUNT  Try to do the count the same time of day each time you do it.  Mark the day and time, then see how long it takes for you to feel 10 movements (kicks, flutters, swishes, rolls). You should have at least 10 movements within 2 hours. You will most likely feel 10 movements in much less than 2 hours. If you do not, wait an hour and count again. After a couple of days you will see a pattern.  What you are looking for is a change in the pattern or not enough counts in 2 hours. Is it taking longer in time to reach 10 movements? SEEK MEDICAL CARE IF:  You feel less than 10 counts in 2 hours. Tried twice.  No movement in one hour.  The pattern is changing or taking longer each day to reach 10 counts in 2 hours.  You feel the baby is not moving as it usually does. Date: ____________ Movements: ____________ Start time: ____________ Finish time: ____________  Date: ____________ Movements: ____________ Start time: ____________ Finish time: ____________ Date: ____________ Movements: ____________ Start time: ____________ Finish time: ____________ Date: ____________ Movements: ____________ Start time: ____________ Finish time: ____________ Date: ____________ Movements: ____________ Start time: ____________ Finish time:  ____________ Date: ____________ Movements: ____________ Start time: ____________ Finish time: ____________ Date: ____________ Movements: ____________ Start time: ____________ Finish time: ____________ Date: ____________ Movements: ____________ Start time: ____________ Finish time: ____________  Date: ____________ Movements: ____________ Start time: ____________ Finish time: ____________ Date: ____________ Movements: ____________ Start time: ____________ Finish time: ____________ Date: ____________ Movements: ____________ Start time: ____________ Finish time: ____________ Date: ____________ Movements: ____________ Start time: ____________ Finish time: ____________ Date: ____________ Movements: ____________ Start time: ____________ Finish time: ____________ Date: ____________ Movements: ____________ Start time: ____________ Finish time: ____________ Date: ____________ Movements: ____________ Start time: ____________ Finish time: ____________  Date: ____________ Movements: ____________ Start time: ____________ Finish time: ____________ Date: ____________ Movements: ____________ Start time: ____________ Finish time: ____________ Date: ____________ Movements: ____________ Start time: ____________ Finish time: ____________ Date: ____________ Movements: ____________ Start time: ____________ Finish time: ____________ Date: ____________ Movements: ____________ Start time: ____________ Finish time: ____________ Date: ____________ Movements: ____________ Start time: ____________ Finish time: ____________ Date: ____________ Movements: ____________ Start time: ____________ Finish time: ____________  Date: ____________ Movements: ____________ Start time: ____________ Finish time: ____________ Date: ____________ Movements: ____________ Start time: ____________ Finish time: ____________ Date: ____________ Movements: ____________ Start time: ____________ Finish time: ____________ Date: ____________ Movements:  ____________ Start time: ____________ Finish time: ____________ Date: ____________ Movements: ____________ Start time: ____________ Finish time: ____________ Date: ____________ Movements: ____________ Start time: ____________ Finish time: ____________ Date: ____________ Movements: ____________ Start time: ____________ Finish time: ____________  Date: ____________ Movements: ____________ Start time: ____________ Finish time: ____________ Date: ____________ Movements: ____________ Start time: ____________ Finish time: ____________ Date: ____________ Movements: ____________ Start time: ____________ Finish time: ____________ Date: ____________ Movements:   ____________ Start time: ____________ Finish time: ____________ Date: ____________ Movements: ____________ Start time: ____________ Finish time: ____________ Date: ____________ Movements: ____________ Start time: ____________ Finish time: ____________ Date: ____________ Movements: ____________ Start time: ____________ Finish time: ____________  Date: ____________ Movements: ____________ Start time: ____________ Finish time: ____________ Date: ____________ Movements: ____________ Start time: ____________ Finish time: ____________ Date: ____________ Movements: ____________ Start time: ____________ Finish time: ____________ Date: ____________ Movements: ____________ Start time: ____________ Finish time: ____________ Date: ____________ Movements: ____________ Start time: ____________ Finish time: ____________ Date: ____________ Movements: ____________ Start time: ____________ Finish time: ____________ Date: ____________ Movements: ____________ Start time: ____________ Finish time: ____________  Date: ____________ Movements: ____________ Start time: ____________ Finish time: ____________ Date: ____________ Movements: ____________ Start time: ____________ Finish time: ____________ Date: ____________ Movements: ____________ Start time: ____________ Finish  time: ____________ Date: ____________ Movements: ____________ Start time: ____________ Finish time: ____________ Date: ____________ Movements: ____________ Start time: ____________ Finish time: ____________ Date: ____________ Movements: ____________ Start time: ____________ Finish time: ____________ Date: ____________ Movements: ____________ Start time: ____________ Finish time: ____________  Date: ____________ Movements: ____________ Start time: ____________ Finish time: ____________ Date: ____________ Movements: ____________ Start time: ____________ Finish time: ____________ Date: ____________ Movements: ____________ Start time: ____________ Finish time: ____________ Date: ____________ Movements: ____________ Start time: ____________ Finish time: ____________ Date: ____________ Movements: ____________ Start time: ____________ Finish time: ____________ Date: ____________ Movements: ____________ Start time: ____________ Finish time: ____________ Document Released: 01/23/2006 Document Revised: 03/18/2011 Document Reviewed: 07/26/2008 [employer] Patient Information 2013 [employer].  

## 2011-11-05 NOTE — Progress Notes (Signed)
FBS 66-82 2 hr pp 74-138 Flu shot given today

## 2011-11-12 ENCOUNTER — Ambulatory Visit (INDEPENDENT_AMBULATORY_CARE_PROVIDER_SITE_OTHER): Payer: BC Managed Care – PPO

## 2011-11-12 ENCOUNTER — Encounter: Payer: Self-pay | Admitting: Obstetrics and Gynecology

## 2011-11-12 ENCOUNTER — Ambulatory Visit (INDEPENDENT_AMBULATORY_CARE_PROVIDER_SITE_OTHER): Payer: BC Managed Care – PPO | Admitting: Obstetrics and Gynecology

## 2011-11-12 ENCOUNTER — Other Ambulatory Visit: Payer: Self-pay | Admitting: Obstetrics and Gynecology

## 2011-11-12 VITALS — BP 106/60 | Wt 151.0 lb

## 2011-11-12 DIAGNOSIS — Z98891 History of uterine scar from previous surgery: Secondary | ICD-10-CM

## 2011-11-12 DIAGNOSIS — O24419 Gestational diabetes mellitus in pregnancy, unspecified control: Secondary | ICD-10-CM

## 2011-11-12 DIAGNOSIS — O99810 Abnormal glucose complicating pregnancy: Secondary | ICD-10-CM

## 2011-11-12 DIAGNOSIS — Z9889 Other specified postprocedural states: Secondary | ICD-10-CM

## 2011-11-12 LAB — US OB FOLLOW UP

## 2011-11-12 NOTE — Progress Notes (Signed)
[redacted]w[redacted]d No complaints.  Plans VBAC Ultrasound shows:  Gestational age by [place]     SIUP  S=D     [place] EDD: 01/03/2012            AFI: 11.88 cm            Cervical length: 4.37 cm            Placenta localization: posterior           Fetal presentation: vertex      Anatomy survey is normal Comments: Vertex Presentation, Posterior Placenta, AFI is normal (30th%) CBGs all excellent

## 2011-11-26 ENCOUNTER — Ambulatory Visit (INDEPENDENT_AMBULATORY_CARE_PROVIDER_SITE_OTHER): Payer: BC Managed Care – PPO | Admitting: Obstetrics and Gynecology

## 2011-11-26 ENCOUNTER — Encounter: Payer: Self-pay | Admitting: Obstetrics and Gynecology

## 2011-11-26 VITALS — BP 110/60 | Wt 152.0 lb

## 2011-11-26 DIAGNOSIS — O99810 Abnormal glucose complicating pregnancy: Secondary | ICD-10-CM

## 2011-11-26 DIAGNOSIS — O24419 Gestational diabetes mellitus in pregnancy, unspecified control: Secondary | ICD-10-CM

## 2011-11-26 NOTE — Progress Notes (Signed)
[redacted]w[redacted]d Pt without c/o GDM fasting blood sugars all normal Pp blood sugars occasionally elevated NST reactive repeat on Friday BPP and growth @[facility] 

## 2011-11-29 ENCOUNTER — Other Ambulatory Visit: Payer: BC Managed Care – PPO

## 2011-12-03 ENCOUNTER — Ambulatory Visit (INDEPENDENT_AMBULATORY_CARE_PROVIDER_SITE_OTHER): Payer: BC Managed Care – PPO | Admitting: Obstetrics and Gynecology

## 2011-12-03 ENCOUNTER — Ambulatory Visit (INDEPENDENT_AMBULATORY_CARE_PROVIDER_SITE_OTHER): Payer: BC Managed Care – PPO

## 2011-12-03 ENCOUNTER — Encounter: Payer: Self-pay | Admitting: Obstetrics and Gynecology

## 2011-12-03 ENCOUNTER — Other Ambulatory Visit: Payer: Self-pay | Admitting: Obstetrics and Gynecology

## 2011-12-03 VITALS — BP 90/58 | Wt 153.0 lb

## 2011-12-03 DIAGNOSIS — O24419 Gestational diabetes mellitus in pregnancy, unspecified control: Secondary | ICD-10-CM

## 2011-12-03 DIAGNOSIS — Z331 Pregnant state, incidental: Secondary | ICD-10-CM

## 2011-12-03 DIAGNOSIS — O99810 Abnormal glucose complicating pregnancy: Secondary | ICD-10-CM

## 2011-12-03 NOTE — Progress Notes (Signed)
[redacted]w[redacted]d  C/O: pain at the top of her stomach that is sometimes as "Nubmbness/pain"   GBS today.

## 2011-12-03 NOTE — Progress Notes (Signed)
[redacted]w[redacted]d Ultrasound: Single gestation, vertex, normal fluid, cervix 3.74 cm, 5 lbs. 11 oz. (36 percentile). Beta strep today. Return office in 1 week. Dr. Stefano Gaul

## 2011-12-05 LAB — STREP B DNA PROBE: GBSP: NEGATIVE

## 2011-12-10 ENCOUNTER — Encounter: Payer: Self-pay | Admitting: Obstetrics and Gynecology

## 2011-12-10 ENCOUNTER — Ambulatory Visit (INDEPENDENT_AMBULATORY_CARE_PROVIDER_SITE_OTHER): Payer: BC Managed Care – PPO | Admitting: Obstetrics and Gynecology

## 2011-12-10 VITALS — BP 90/56 | Wt 153.5 lb

## 2011-12-10 DIAGNOSIS — Z331 Pregnant state, incidental: Secondary | ICD-10-CM

## 2011-12-10 DIAGNOSIS — O99810 Abnormal glucose complicating pregnancy: Secondary | ICD-10-CM

## 2011-12-10 DIAGNOSIS — O24419 Gestational diabetes mellitus in pregnancy, unspecified control: Secondary | ICD-10-CM

## 2011-12-10 NOTE — Progress Notes (Signed)
[redacted]w[redacted]d Pt c/o BH's getting more painful

## 2011-12-10 NOTE — Progress Notes (Signed)
[redacted]w[redacted]d GBS neg Doing well rv'd CBG's, most have been normal, had some pc lunch elevations, high of 175 This was over thanksgiving, rv'd diet and increasing exercise  GFM Pt wants VBAC rv'd IOL at 40wks w foley balloon  rv'd w Dr Stefano Gaul, pt needs growth [place] every 4wks, last one at 35wks, will plan to do at 39wks if no delivery (last delivery at 36wks for PPROM, breech) FKC, labor sx's GBS neg

## 2011-12-12 ENCOUNTER — Telehealth: Payer: Self-pay

## 2011-12-12 LAB — US OB FOLLOW UP

## 2011-12-12 NOTE — Telephone Encounter (Signed)
LM for pt to return call. Pt needs u/s appt for growth per SL.

## 2011-12-13 ENCOUNTER — Telehealth: Payer: Self-pay

## 2011-12-13 NOTE — Telephone Encounter (Signed)
Spoke with pt informing her u/s needs to be at 39 wks and Weds 11th she will not be 39 wks yet. Informed pt during visit on Weds, we will schedule u/s for growth then.

## 2011-12-13 NOTE — Telephone Encounter (Signed)
Spoke with pt informing her per SL growth u/s is needed. Appt scheduled 12/18/11 @ 8:30 am and pt will be seen directly after u/s before 10 am. Also informed pt SL is in the process of setting up an induction for her. Pt states she will discuss induction further with HS during Wed. appt. Pt agrees and voices understanding.

## 2011-12-18 ENCOUNTER — Ambulatory Visit (INDEPENDENT_AMBULATORY_CARE_PROVIDER_SITE_OTHER): Payer: BC Managed Care – PPO

## 2011-12-18 ENCOUNTER — Other Ambulatory Visit: Payer: BC Managed Care – PPO

## 2011-12-18 VITALS — BP 100/62 | Wt 153.0 lb

## 2011-12-18 DIAGNOSIS — O24419 Gestational diabetes mellitus in pregnancy, unspecified control: Secondary | ICD-10-CM

## 2011-12-18 DIAGNOSIS — O99810 Abnormal glucose complicating pregnancy: Secondary | ICD-10-CM

## 2011-12-18 NOTE — Progress Notes (Signed)
[redacted]w[redacted]d Pt wants cx checked for comfort  Pt has CBG's today

## 2011-12-18 NOTE — Progress Notes (Signed)
[redacted]w[redacted]d; cbg's WNL except 3 supper's elevated w/ high of those=130.  Still desires VBAC.   Pt ok w/ IOL at 40-40.5 weeks.  Rev'd FKC and labor s/s.  Feels GDM r/t increased stress and father's death last 2022-04-14.

## 2011-12-19 ENCOUNTER — Other Ambulatory Visit: Payer: Self-pay

## 2011-12-19 DIAGNOSIS — O24419 Gestational diabetes mellitus in pregnancy, unspecified control: Secondary | ICD-10-CM

## 2011-12-20 ENCOUNTER — Telehealth (HOSPITAL_COMMUNITY): Payer: Self-pay | Admitting: *Deleted

## 2011-12-20 ENCOUNTER — Telehealth: Payer: Self-pay | Admitting: Obstetrics and Gynecology

## 2011-12-20 ENCOUNTER — Encounter (HOSPITAL_COMMUNITY): Payer: Self-pay | Admitting: *Deleted

## 2011-12-20 NOTE — Telephone Encounter (Signed)
Induction scheduled for 01/03/12 @ 7:30pm with ND/VL. -Adrianne Pridgen

## 2011-12-20 NOTE — Telephone Encounter (Signed)
Preadmission screen  

## 2011-12-25 ENCOUNTER — Encounter: Payer: Self-pay | Admitting: Obstetrics and Gynecology

## 2011-12-25 ENCOUNTER — Ambulatory Visit (INDEPENDENT_AMBULATORY_CARE_PROVIDER_SITE_OTHER): Payer: BC Managed Care – PPO | Admitting: Obstetrics and Gynecology

## 2011-12-25 VITALS — BP 102/70 | Wt 154.0 lb

## 2011-12-25 DIAGNOSIS — O99810 Abnormal glucose complicating pregnancy: Secondary | ICD-10-CM

## 2011-12-25 DIAGNOSIS — O24419 Gestational diabetes mellitus in pregnancy, unspecified control: Secondary | ICD-10-CM

## 2011-12-25 DIAGNOSIS — Z331 Pregnant state, incidental: Secondary | ICD-10-CM

## 2011-12-25 NOTE — Progress Notes (Signed)
[redacted]w[redacted]d Diet controlled GDM - CBGs are good may reduce to checking 2x/day FKCs and Labor Precautions Already sched for induction on the 27th Reviewed slightly increased risk for uterine rupture with IOL and pt verbalized understanding and wants to proceed Pt is scheduled for u/s for EFW next week as well

## 2011-12-25 NOTE — Addendum Note (Signed)
Addended by: Marla Roe A on: 12/25/2011 10:53 AM   Modules accepted: Orders

## 2011-12-31 ENCOUNTER — Ambulatory Visit (INDEPENDENT_AMBULATORY_CARE_PROVIDER_SITE_OTHER): Payer: BC Managed Care – PPO | Admitting: Obstetrics and Gynecology

## 2011-12-31 ENCOUNTER — Other Ambulatory Visit: Payer: Self-pay | Admitting: Obstetrics and Gynecology

## 2011-12-31 ENCOUNTER — Ambulatory Visit (INDEPENDENT_AMBULATORY_CARE_PROVIDER_SITE_OTHER): Payer: BC Managed Care – PPO

## 2011-12-31 ENCOUNTER — Encounter: Payer: Self-pay | Admitting: Obstetrics and Gynecology

## 2011-12-31 VITALS — BP 106/60 | Wt 153.0 lb

## 2011-12-31 DIAGNOSIS — O24419 Gestational diabetes mellitus in pregnancy, unspecified control: Secondary | ICD-10-CM

## 2011-12-31 DIAGNOSIS — O99810 Abnormal glucose complicating pregnancy: Secondary | ICD-10-CM

## 2011-12-31 NOTE — Progress Notes (Signed)
Ultrasound shows:  SIUP  S=D     [place] EDD: 01/03/12           EFW: 7 lbs 3 oz 52%           AFI: 13.5           Cervical length: not measured           Placenta localization: posterior           Fetal presentation: vertex

## 2011-12-31 NOTE — Progress Notes (Signed)
[redacted]w[redacted]d GFM  Scheduled for IOL Friday 01/03/12 pm  Class A1 DB: well controlled all FBS are normal Ultrasound: AGA with BPP 8/8 and normal AFI

## 2011-12-31 NOTE — Progress Notes (Signed)
[redacted]w[redacted]d Pt has no complaints  Pt wants cervix checked  Pt has glucose log

## 2012-01-03 ENCOUNTER — Inpatient Hospital Stay (HOSPITAL_COMMUNITY)
Admission: AD | Admit: 2012-01-03 | Payer: BC Managed Care – PPO | Source: Ambulatory Visit | Admitting: Obstetrics and Gynecology

## 2012-01-03 ENCOUNTER — Encounter (HOSPITAL_COMMUNITY): Payer: Self-pay

## 2012-01-03 ENCOUNTER — Inpatient Hospital Stay (HOSPITAL_COMMUNITY)
Admission: RE | Admit: 2012-01-03 | Discharge: 2012-01-06 | DRG: 651 | Disposition: A | Discharge status: Home / Self Care | Payer: BC Managed Care – PPO | Source: Ambulatory Visit | Attending: Obstetrics and Gynecology | Admitting: Obstetrics and Gynecology

## 2012-01-03 VITALS — BP 97/63 | HR 77 | Temp 98.3°F | Resp 18 | Ht 59.0 in | Wt 153.0 lb

## 2012-01-03 DIAGNOSIS — Z98891 History of uterine scar from previous surgery: Secondary | ICD-10-CM

## 2012-01-03 DIAGNOSIS — O34219 Maternal care for unspecified type scar from previous cesarean delivery: Secondary | ICD-10-CM | POA: Diagnosis present

## 2012-01-03 DIAGNOSIS — Z302 Encounter for sterilization: Secondary | ICD-10-CM

## 2012-01-03 DIAGNOSIS — O9081 Anemia of the puerperium: Secondary | ICD-10-CM | POA: Diagnosis not present

## 2012-01-03 DIAGNOSIS — O99814 Abnormal glucose complicating childbirth: Principal | ICD-10-CM | POA: Diagnosis present

## 2012-01-03 HISTORY — DX: History of uterine scar from previous surgery: Z98.891

## 2012-01-03 HISTORY — DX: Encounter for sterilization: Z30.2

## 2012-01-03 LAB — CBC
MCH: 31 pg (ref 26.0–34.0)
Platelets: 188 10*3/uL (ref 150–400)
RBC: 4.1 MIL/uL (ref 3.87–5.11)

## 2012-01-03 LAB — US OB FOLLOW UP

## 2012-01-03 LAB — TYPE AND SCREEN

## 2012-01-03 MED ORDER — OXYTOCIN BOLUS FROM INFUSION: 500.0000 mL | INTRAVENOUS | Status: DC

## 2012-01-03 MED ORDER — CITRIC ACID-SODIUM CITRATE 334-500 MG/5ML PO SOLN
30.0000 mL | ORAL | Status: DC | PRN
  Filled 2012-01-03: qty 15

## 2012-01-03 MED ORDER — LACTATED RINGERS IV SOLN
500.0000 mL | INTRAVENOUS | Status: DC | PRN
  Administered 2012-01-04: 500 mL via INTRAVENOUS

## 2012-01-03 MED ORDER — TERBUTALINE SULFATE 1 MG/ML IJ SOLN
0.2500 mg | Freq: Once | INTRAMUSCULAR | Status: AC | PRN
  Filled 2012-01-03: qty 1

## 2012-01-03 MED ORDER — OXYTOCIN 40 UNITS IN LACTATED RINGERS INFUSION - SIMPLE MED: 1.0000 m[IU]/min | INTRAVENOUS | Status: DC

## 2012-01-03 MED ORDER — OXYCODONE-ACETAMINOPHEN 5-325 MG PO TABS: 1.0000 | ORAL_TABLET | ORAL | Status: DC | PRN

## 2012-01-03 MED ORDER — LACTATED RINGERS IV SOLN
INTRAVENOUS | Status: DC
  Administered 2012-01-03: 22:00:00 via INTRAVENOUS

## 2012-01-03 MED ORDER — FENTANYL CITRATE 0.05 MG/ML IJ SOLN
100.0000 ug | INTRAMUSCULAR | Status: DC | PRN
  Administered 2012-01-03 – 2012-01-04 (×2): 100 ug via INTRAVENOUS
  Filled 2012-01-03 (×3): qty 2

## 2012-01-03 MED ORDER — ONDANSETRON HCL 4 MG/2ML IJ SOLN: 4.0000 mg | Freq: Four times a day (QID) | INTRAMUSCULAR | Status: DC | PRN

## 2012-01-03 MED ORDER — ACETAMINOPHEN 325 MG PO TABS: 650.0000 mg | ORAL_TABLET | ORAL | Status: DC | PRN

## 2012-01-03 MED ORDER — IBUPROFEN 600 MG PO TABS: 600.0000 mg | ORAL_TABLET | Freq: Four times a day (QID) | ORAL | Status: DC | PRN

## 2012-01-03 MED ORDER — OXYTOCIN 40 UNITS IN LACTATED RINGERS INFUSION - SIMPLE MED: 62.5000 mL/h | INTRAVENOUS | Status: DC

## 2012-01-03 MED ORDER — DIPHENHYDRAMINE HCL 25 MG PO CAPS
25.0000 mg | ORAL_CAPSULE | ORAL | Status: DC | PRN
  Administered 2012-01-03: 25 mg via ORAL
  Filled 2012-01-03: qty 1

## 2012-01-03 MED ORDER — LIDOCAINE HCL (PF) 1 % IJ SOLN
INTRAMUSCULAR | Status: AC
  Filled 2012-01-03: qty 30

## 2012-01-03 MED ORDER — FLEET ENEMA 7-19 GM/118ML RE ENEM: 1.0000 | ENEMA | RECTAL | Status: DC | PRN

## 2012-01-03 MED ORDER — ZOLPIDEM TARTRATE 5 MG PO TABS: 5.0000 mg | ORAL_TABLET | Freq: Every evening | ORAL | Status: DC | PRN

## 2012-01-03 NOTE — Progress Notes (Signed)
Subjective: Pt just up to [facility] to have BM, and foley bulb came out.  Pt feeling some cramping.  Husband at bedside and supportive.  Objective: BP 115/75  Pulse 76  Temp 98.1 F (36.7 C) (Oral)  Resp 20  Ht 4\' 11"  (1.499 m)  Wt 153 lb (69.4 kg)  BMI 30.90 kg/m2  LMP 03/20/2011      FHT:  FHR: 135 bpm, variability: moderate,  accelerations:  Present,  decelerations:  Absent UC:   irregular, every 4-6 minutes SVE:   Dilation: 3.5 Effacement (%): 60 Station: -2 Exam by:: H. Jolie Strohecker, CNM Mod amt of bloody show noted; vtx still high and asynclitic; membranes swept; cervix stretchy Labs: Lab Results  Component Value Date   WBC 9.3 01/03/2012   HGB 12.7 01/03/2012   HCT 35.7* 01/03/2012   MCV 87.1 01/03/2012   PLT 188 01/03/2012    Assessment / Plan: 1. 40 weeks 2. IOL for GDM-diet controlled 3. GBS neg 4. TOLAC  Labor: progressing normally; foley bulb out after 2 hrs Preeclampsia:  no signs or symptoms of toxicity Fetal Wellbeing:  Category I Pain Control:  Labor support without medications I/D:  n/a Anticipated MOD:  NSVD 1. Pt desires Benadryl vs Ambien to help w/ sleep 2.  Will CTO for labor progress overnight; Pitocin prn 3. Pt plans epidural eventually in labor 4. C/w MD prn  Tyliek Timberman H 01/03/2012, 11:13 PM

## 2012-01-03 NOTE — Plan of Care (Signed)
Problem: Consults Goal: Birthing Suites Patient Information Press F2 to bring up selections list Outcome: Completed/Met Date Met:  01/03/12  Inpatient induction

## 2012-01-03 NOTE — H&P (Signed)
Alison Reed is a 36 y.o. female, G2P1001 at 40 weeks, presenting for induction due to gestational diabetes, diet controlled.  Patient has been counseled on the risks of VBAC, including risk of uterine rupture with induction, failure of method, prolonged labor, need for repeat C/S, fetal intolerance to labor, and still wishes to proceed with induction. Denies leaking or bleeding, reports +FM.  Patient Active Problem List  Diagnosis  . Anemia  . History of rectal bleeding  . Hx of dysmenorrhea  . Pregnant state, incidental  . History of cesarean delivery - 36wks, in labor, breech - w [facility]  . GDM (gestational diabetes mellitus)    History of present pregnancy: Patient entered care at 10-11 weeks.   EDC of 01/03/12 was established by early [place].   Anatomy scan:  17 4/7 weeks, with normal findings and an posterior placenta.   Additional [place] evaluations:   32 4/7 weeks, with normal growth and fluid. 35 4/7 weeks, with normal growth and fluid 39 weeks, normal growth (7+3, 52%ile) and fluid   Significant prenatal events:  Normal 1st trimester screen and AFP.  Elevated 1 hour glucola, with abnormal 3 hour GTT = GDM.  Has continued to be diet controlled, with stable CBGs.  Patient desires VBAC, was counseled re:  Increased risk of rupture with induction, and the 67% predictive success rate for VBAC in her case. Last evaluation:  12/31/11, cervix closed, 50%, vtx, -2.  OB History    Grav Para Term Preterm Abortions TAB SAB Ect Mult Living   3 1 0 1 1  1  0  1    2010--SAB at 8 weeks 2011--primary LTCS for breech in labor, female, 5+4, spinal.  Past Medical History  Diagnosis Date  . Yeast infection   . History of chicken pox   . History of rectal bleeding 02/06/04  . Headache     SINUS;ALLERGY RELATED  . Infection     YEAST;NOT FREQ  . PMS (premenstrual syndrome)   . Premature baby     @ 30 WKS;HAD SEVERAL SURGERIES;HAD COLOSTOMY  . Diabetes mellitus without complication   .  Gestational diabetes     diet controlled   Past Surgical History  Procedure Date  . Wisdom tooth extraction   . Colostomy   . Cesarean section 10/30/2009  . Abdominal surgery     infant abdomen reconstruction  . Abdominal surgery     revise scar   Family History: family history includes Cancer in her father; Heart disease in her maternal grandmother; and Thyroid disease in her maternal grandmother.  She is adopted.  Social History:  reports that she has never smoked. She has never used smokeless tobacco. She reports that she does not drink alcohol or use illicit drugs.  Husband, Alison Reed, is involved and supportive.  Patient is [facility].   Prenatal Transfer Tool  Maternal Diabetes: Yes:  Diabetes Type:  Diet controlled Genetic Screening: Normal Maternal Ultrasounds/Referrals: Normal Fetal Ultrasounds or other Referrals:  None Maternal Substance Abuse:  No Significant Maternal Medications:  None Significant Maternal Lab Results: Lab values include: Group B Strep negative    ROS:  +FM, occasional contractions  No Known Allergies   Dilation: 1 Effacement (%): 50 Station: -1 Exam by:: Manfred Arch, CNM Blood pressure 115/75, pulse 76, temperature 98.1 F (36.7 C), temperature source Oral, resp. rate 20, height 4\' 11"  (1.499 m), weight 153 lb (69.4 kg), last menstrual period 03/20/2011.  Chest clear Heart RRR without murmur Abd gravid, NT, FH  39 cm Pelvic: 1 cm, 50%, vtx, -1, cervix anterior. Ext: WNL  FHR: Category 1 UCs:  Irregular, mild.  Prenatal labs: ABO, Rh: O/POS/-- (05/28 1611) Antibody: NEG (05/28 1611) Rubella:   Immune RPR: NON REAC (10/01 1527)  HBsAg: NEGATIVE (05/28 1611)  HIV: NON REACTIVE (05/28 1611)  GBS: NEGATIVE (11/26 1732) Sickle cell/Hgb electrophoresis:  NA Pap:  05/2011 GC:  05/2011 Chlamydia:  05/2011 Genetic screenings:  Normal 1st trimester screen and AFP Glucola:  Elevated 1 hour glucola, 2/4 abnormal values on 3 hour GTT Hgb 12.9 at NOB,  12.0 at 28 weeks. RPR NR at 28 weeks.       Assessment/Plan: IUP at 40 weeks Previous C/S, desires VBAC Gestational diabetes, diet-controlled Unfavorable cervix GBS negative. Desires BTL if has requires repeat C/S  Plan: Admit to [facility] Suite per consult with Dr. Normand Sloop Routine [facility] orders Plan foley bulb placement, then pitocin when foley falls out. CBG q 4 hours while awake. Reviewed R&B of induction and VBAC, including failure of trial, prolonged labor, need for repeat C/S, and possible uterine rupture.  Offered repeat C/S in the am and/or prn patient choice.  Patient and husband seem to understand these risks and wish to proceed with induction and VBAC trial, and decline C/S at this time.  Alison Reed, VICKICNM, [place] 01/03/2012, 9:14 PM  Addendum: Foley bulb placed without difficulty, and inflated with 60 cc saline. When falls out, will start pitocin--if before 7am, will start per low dose protocol and hold when 4 mu/min is achieved. At 7 am, will start increasing pitocin per routine protocol. Plan close observation of maternal/fetal status in light of previous C/S.  Alison Reed, CNM 01/03/12 9:15pm

## 2012-01-04 ENCOUNTER — Inpatient Hospital Stay (HOSPITAL_COMMUNITY): Payer: BC Managed Care – PPO

## 2012-01-04 ENCOUNTER — Encounter (HOSPITAL_COMMUNITY): Payer: Self-pay | Admitting: Anesthesiology

## 2012-01-04 ENCOUNTER — Encounter (HOSPITAL_COMMUNITY): Payer: Self-pay

## 2012-01-04 ENCOUNTER — Inpatient Hospital Stay (HOSPITAL_COMMUNITY): Payer: BC Managed Care – PPO | Admitting: Anesthesiology

## 2012-01-04 ENCOUNTER — Encounter (HOSPITAL_COMMUNITY)
Admission: RE | Disposition: A | Discharge status: Home / Self Care | Payer: Self-pay | Source: Ambulatory Visit | Attending: Obstetrics and Gynecology

## 2012-01-04 DIAGNOSIS — Z98891 History of uterine scar from previous surgery: Secondary | ICD-10-CM | POA: Diagnosis not present

## 2012-01-04 DIAGNOSIS — O368990 Maternal care for other specified fetal problems, unspecified trimester, not applicable or unspecified: Secondary | ICD-10-CM

## 2012-01-04 DIAGNOSIS — O43899 Other placental disorders, unspecified trimester: Secondary | ICD-10-CM

## 2012-01-04 DIAGNOSIS — Z302 Encounter for sterilization: Secondary | ICD-10-CM

## 2012-01-04 DIAGNOSIS — O34219 Maternal care for unspecified type scar from previous cesarean delivery: Secondary | ICD-10-CM

## 2012-01-04 DIAGNOSIS — O99814 Abnormal glucose complicating childbirth: Secondary | ICD-10-CM

## 2012-01-04 HISTORY — DX: History of uterine scar from previous surgery: Z98.891

## 2012-01-04 LAB — GLUCOSE, CAPILLARY: Glucose-Capillary: 89 mg/dL (ref 70–99)

## 2012-01-04 LAB — RPR: RPR Ser Ql: NONREACTIVE

## 2012-01-04 SURGERY — Surgical Case
Anesthesia: Epidural | Site: Abdomen | Wound class: Clean Contaminated

## 2012-01-04 MED ORDER — PHENYLEPHRINE 40 MCG/ML (10ML) SYRINGE FOR IV PUSH (FOR BLOOD PRESSURE SUPPORT): 80.0000 ug | PREFILLED_SYRINGE | INTRAVENOUS | Status: DC | PRN

## 2012-01-04 MED ORDER — FERROUS SULFATE 325 (65 FE) MG PO TABS
325.0000 mg | ORAL_TABLET | Freq: Two times a day (BID) | ORAL | Status: DC
  Administered 2012-01-04 – 2012-01-06 (×4): 325 mg via ORAL
  Filled 2012-01-04 (×4): qty 1

## 2012-01-04 MED ORDER — OXYTOCIN 40 UNITS IN LACTATED RINGERS INFUSION - SIMPLE MED: 62.5000 mL/h | INTRAVENOUS | Status: AC

## 2012-01-04 MED ORDER — SCOPOLAMINE 1 MG/3DAYS TD PT72
1.0000 | MEDICATED_PATCH | Freq: Once | TRANSDERMAL | Status: DC
  Administered 2012-01-04: 1.5 mg via TRANSDERMAL

## 2012-01-04 MED ORDER — IBUPROFEN 600 MG PO TABS
600.0000 mg | ORAL_TABLET | Freq: Four times a day (QID) | ORAL | Status: DC
  Administered 2012-01-04 – 2012-01-06 (×8): 600 mg via ORAL
  Filled 2012-01-04 (×3): qty 1

## 2012-01-04 MED ORDER — SCOPOLAMINE 1 MG/3DAYS TD PT72
MEDICATED_PATCH | TRANSDERMAL | Status: AC
  Administered 2012-01-04: 1.5 mg via TRANSDERMAL
  Filled 2012-01-04: qty 1

## 2012-01-04 MED ORDER — MENTHOL 3 MG MT LOZG: 1.0000 | LOZENGE | OROMUCOSAL | Status: DC | PRN

## 2012-01-04 MED ORDER — MEASLES, MUMPS & RUBELLA VAC ~~LOC~~ INJ
0.5000 mL | INJECTION | Freq: Once | SUBCUTANEOUS | Status: DC
  Filled 2012-01-04: qty 0.5

## 2012-01-04 MED ORDER — ONDANSETRON HCL 4 MG/2ML IJ SOLN: 4.0000 mg | Freq: Three times a day (TID) | INTRAMUSCULAR | Status: DC | PRN

## 2012-01-04 MED ORDER — DIPHENHYDRAMINE HCL 50 MG/ML IJ SOLN: 12.5000 mg | INTRAMUSCULAR | Status: DC | PRN

## 2012-01-04 MED ORDER — NALOXONE HCL 1 MG/ML IJ SOLN
1.0000 ug/kg/h | INTRAVENOUS | Status: DC | PRN
  Filled 2012-01-04: qty 2

## 2012-01-04 MED ORDER — SODIUM BICARBONATE 8.4 % IV SOLN
INTRAVENOUS | Status: AC
  Filled 2012-01-04: qty 50

## 2012-01-04 MED ORDER — ZOLPIDEM TARTRATE 5 MG PO TABS: 5.0000 mg | ORAL_TABLET | Freq: Every evening | ORAL | Status: DC | PRN

## 2012-01-04 MED ORDER — EPHEDRINE 5 MG/ML INJ
10.0000 mg | INTRAVENOUS | Status: DC | PRN
  Filled 2012-01-04: qty 4

## 2012-01-04 MED ORDER — LACTATED RINGERS IV SOLN
INTRAVENOUS | Status: DC | PRN
  Administered 2012-01-04: 06:00:00 via INTRAVENOUS

## 2012-01-04 MED ORDER — CITRIC ACID-SODIUM CITRATE 334-500 MG/5ML PO SOLN
30.0000 mL | Freq: Once | ORAL | Status: AC
  Administered 2012-01-04: 30 mL via ORAL

## 2012-01-04 MED ORDER — SODIUM BICARBONATE 8.4 % IV SOLN
INTRAVENOUS | Status: DC | PRN
  Administered 2012-01-04: 10 mL via EPIDURAL

## 2012-01-04 MED ORDER — EPHEDRINE 5 MG/ML INJ
INTRAVENOUS | Status: AC
  Filled 2012-01-04: qty 10

## 2012-01-04 MED ORDER — NALBUPHINE HCL 10 MG/ML IJ SOLN
5.0000 mg | INTRAMUSCULAR | Status: DC | PRN
  Filled 2012-01-04: qty 1

## 2012-01-04 MED ORDER — EPHEDRINE 5 MG/ML INJ: 10.0000 mg | INTRAVENOUS | Status: DC | PRN

## 2012-01-04 MED ORDER — DIPHENHYDRAMINE HCL 25 MG PO CAPS: 25.0000 mg | ORAL_CAPSULE | Freq: Four times a day (QID) | ORAL | Status: DC | PRN

## 2012-01-04 MED ORDER — CEFAZOLIN SODIUM-DEXTROSE 2-3 GM-% IV SOLR
INTRAVENOUS | Status: AC
  Filled 2012-01-04: qty 50

## 2012-01-04 MED ORDER — 0.9 % SODIUM CHLORIDE (POUR BTL) OPTIME
TOPICAL | Status: DC | PRN
  Administered 2012-01-04: 2000 mL

## 2012-01-04 MED ORDER — LANOLIN HYDROUS EX OINT: 1.0000 "application " | TOPICAL_OINTMENT | CUTANEOUS | Status: DC | PRN

## 2012-01-04 MED ORDER — KETOROLAC TROMETHAMINE 30 MG/ML IJ SOLN
30.0000 mg | Freq: Four times a day (QID) | INTRAMUSCULAR | Status: AC | PRN
  Administered 2012-01-04: 30 mg via INTRAVENOUS
  Filled 2012-01-04: qty 1

## 2012-01-04 MED ORDER — OXYTOCIN 10 UNIT/ML IJ SOLN
40.0000 [IU] | INTRAVENOUS | Status: DC | PRN
  Administered 2012-01-04: 40 [IU] via INTRAVENOUS

## 2012-01-04 MED ORDER — WITCH HAZEL-GLYCERIN EX PADS: 1.0000 "application " | MEDICATED_PAD | CUTANEOUS | Status: DC | PRN

## 2012-01-04 MED ORDER — SENNOSIDES-DOCUSATE SODIUM 8.6-50 MG PO TABS
2.0000 | ORAL_TABLET | Freq: Every day | ORAL | Status: DC
  Administered 2012-01-04 – 2012-01-05 (×2): 2 via ORAL

## 2012-01-04 MED ORDER — LACTATED RINGERS IV SOLN: 500.0000 mL | Freq: Once | INTRAVENOUS | Status: DC

## 2012-01-04 MED ORDER — ONDANSETRON HCL 4 MG PO TABS: 4.0000 mg | ORAL_TABLET | ORAL | Status: DC | PRN

## 2012-01-04 MED ORDER — BISACODYL 10 MG RE SUPP: 10.0000 mg | Freq: Every day | RECTAL | Status: DC | PRN

## 2012-01-04 MED ORDER — OXYCODONE-ACETAMINOPHEN 5-325 MG PO TABS
1.0000 | ORAL_TABLET | ORAL | Status: DC | PRN
  Administered 2012-01-04: 1 via ORAL
  Administered 2012-01-05 (×2): 2 via ORAL
  Administered 2012-01-05 (×3): 1 via ORAL
  Administered 2012-01-05 – 2012-01-06 (×2): 2 via ORAL
  Filled 2012-01-04: qty 2
  Filled 2012-01-04 (×2): qty 1
  Filled 2012-01-04 (×3): qty 2
  Filled 2012-01-04 (×2): qty 1

## 2012-01-04 MED ORDER — ONDANSETRON HCL 4 MG/2ML IJ SOLN
INTRAMUSCULAR | Status: DC | PRN
  Administered 2012-01-04: 4 mg via INTRAVENOUS

## 2012-01-04 MED ORDER — NALOXONE HCL 0.4 MG/ML IJ SOLN: 0.4000 mg | INTRAMUSCULAR | Status: DC | PRN

## 2012-01-04 MED ORDER — SIMETHICONE 80 MG PO CHEW: 80.0000 mg | CHEWABLE_TABLET | ORAL | Status: DC | PRN

## 2012-01-04 MED ORDER — FENTANYL 2.5 MCG/ML BUPIVACAINE 1/10 % EPIDURAL INFUSION (WH - ANES): 14.0000 mL/h | INTRAMUSCULAR | Status: DC

## 2012-01-04 MED ORDER — SIMETHICONE 80 MG PO CHEW
80.0000 mg | CHEWABLE_TABLET | Freq: Three times a day (TID) | ORAL | Status: DC
  Administered 2012-01-04 – 2012-01-06 (×8): 80 mg via ORAL

## 2012-01-04 MED ORDER — TERBUTALINE SULFATE 1 MG/ML IJ SOLN
0.2500 mg | Freq: Once | INTRAMUSCULAR | Status: AC
  Administered 2012-01-04: 0.25 mg via SUBCUTANEOUS

## 2012-01-04 MED ORDER — ONDANSETRON HCL 4 MG/2ML IJ SOLN: 4.0000 mg | INTRAMUSCULAR | Status: DC | PRN

## 2012-01-04 MED ORDER — LACTATED RINGERS IV SOLN
INTRAVENOUS | Status: DC | PRN
  Administered 2012-01-04: 05:00:00 via INTRAVENOUS

## 2012-01-04 MED ORDER — PRENATAL MULTIVITAMIN CH
1.0000 | ORAL_TABLET | Freq: Every day | ORAL | Status: DC
  Administered 2012-01-04 – 2012-01-06 (×3): 1 via ORAL
  Filled 2012-01-04 (×3): qty 1

## 2012-01-04 MED ORDER — MORPHINE SULFATE (PF) 0.5 MG/ML IJ SOLN
INTRAMUSCULAR | Status: DC | PRN
  Administered 2012-01-04: 3 mg via EPIDURAL

## 2012-01-04 MED ORDER — MORPHINE SULFATE 0.5 MG/ML IJ SOLN
INTRAMUSCULAR | Status: AC
  Filled 2012-01-04: qty 10

## 2012-01-04 MED ORDER — CEFAZOLIN SODIUM-DEXTROSE 2-3 GM-% IV SOLR
INTRAVENOUS | Status: DC | PRN
  Administered 2012-01-04: 2 g via INTRAVENOUS

## 2012-01-04 MED ORDER — LACTATED RINGERS IV SOLN
INTRAVENOUS | Status: DC
  Administered 2012-01-04: 13:00:00 via INTRAVENOUS

## 2012-01-04 MED ORDER — LIDOCAINE HCL (PF) 1 % IJ SOLN
INTRAMUSCULAR | Status: DC | PRN
  Administered 2012-01-04 (×3): 4 mL

## 2012-01-04 MED ORDER — ONDANSETRON HCL 4 MG/2ML IJ SOLN
INTRAMUSCULAR | Status: AC
  Filled 2012-01-04: qty 2

## 2012-01-04 MED ORDER — LORATADINE 10 MG PO TABS
10.0000 mg | ORAL_TABLET | Freq: Every day | ORAL | Status: DC
  Filled 2012-01-04 (×2): qty 1

## 2012-01-04 MED ORDER — PHENYLEPHRINE 40 MCG/ML (10ML) SYRINGE FOR IV PUSH (FOR BLOOD PRESSURE SUPPORT)
80.0000 ug | PREFILLED_SYRINGE | INTRAVENOUS | Status: DC | PRN
  Filled 2012-01-04: qty 5

## 2012-01-04 MED ORDER — LIDOCAINE-EPINEPHRINE (PF) 2 %-1:200000 IJ SOLN
INTRAMUSCULAR | Status: AC
  Filled 2012-01-04: qty 20

## 2012-01-04 MED ORDER — LACTATED RINGERS IV SOLN: INTRAVENOUS | Status: DC

## 2012-01-04 MED ORDER — IBUPROFEN 600 MG PO TABS
600.0000 mg | ORAL_TABLET | Freq: Four times a day (QID) | ORAL | Status: DC | PRN
  Filled 2012-01-04 (×5): qty 1

## 2012-01-04 MED ORDER — DIPHENHYDRAMINE HCL 50 MG/ML IJ SOLN: 25.0000 mg | INTRAMUSCULAR | Status: DC | PRN

## 2012-01-04 MED ORDER — FLEET ENEMA 7-19 GM/118ML RE ENEM: 1.0000 | ENEMA | Freq: Every day | RECTAL | Status: DC | PRN

## 2012-01-04 MED ORDER — OXYTOCIN 10 UNIT/ML IJ SOLN
INTRAMUSCULAR | Status: AC
  Filled 2012-01-04: qty 4

## 2012-01-04 MED ORDER — SODIUM CHLORIDE 0.9 % IJ SOLN: 3.0000 mL | INTRAMUSCULAR | Status: DC | PRN

## 2012-01-04 MED ORDER — FENTANYL CITRATE 0.05 MG/ML IJ SOLN: 25.0000 ug | INTRAMUSCULAR | Status: DC | PRN

## 2012-01-04 MED ORDER — MEPERIDINE HCL 25 MG/ML IJ SOLN: 6.2500 mg | INTRAMUSCULAR | Status: DC | PRN

## 2012-01-04 MED ORDER — DIBUCAINE 1 % RE OINT: 1.0000 "application " | TOPICAL_OINTMENT | RECTAL | Status: DC | PRN

## 2012-01-04 MED ORDER — METHYLERGONOVINE MALEATE 0.2 MG/ML IJ SOLN
INTRAMUSCULAR | Status: DC | PRN
  Administered 2012-01-04: 0.2 mg via INTRAMUSCULAR

## 2012-01-04 MED ORDER — FENTANYL 2.5 MCG/ML BUPIVACAINE 1/10 % EPIDURAL INFUSION (WH - ANES)
14.0000 mL/h | INTRAMUSCULAR | Status: DC
  Administered 2012-01-04: 12 mL/h via EPIDURAL
  Filled 2012-01-04: qty 125

## 2012-01-04 MED ORDER — KETOROLAC TROMETHAMINE 60 MG/2ML IM SOLN
60.0000 mg | Freq: Once | INTRAMUSCULAR | Status: AC | PRN
  Administered 2012-01-04: 60 mg via INTRAMUSCULAR

## 2012-01-04 MED ORDER — TETANUS-DIPHTH-ACELL PERTUSSIS 5-2.5-18.5 LF-MCG/0.5 IM SUSP
0.5000 mL | Freq: Once | INTRAMUSCULAR | Status: AC
  Administered 2012-01-05: 0.5 mL via INTRAMUSCULAR
  Filled 2012-01-04: qty 0.5

## 2012-01-04 MED ORDER — KETOROLAC TROMETHAMINE 60 MG/2ML IM SOLN
INTRAMUSCULAR | Status: AC
  Administered 2012-01-04: 60 mg via INTRAMUSCULAR
  Filled 2012-01-04: qty 2

## 2012-01-04 MED ORDER — METOCLOPRAMIDE HCL 5 MG/ML IJ SOLN: 10.0000 mg | Freq: Three times a day (TID) | INTRAMUSCULAR | Status: DC | PRN

## 2012-01-04 MED ORDER — LORATADINE 10 MG PO TABS
10.0000 mg | ORAL_TABLET | Freq: Every day | ORAL | Status: DC
  Administered 2012-01-04 – 2012-01-05 (×2): 10 mg via ORAL
  Filled 2012-01-04 (×3): qty 1

## 2012-01-04 MED ORDER — DIPHENHYDRAMINE HCL 25 MG PO CAPS: 25.0000 mg | ORAL_CAPSULE | ORAL | Status: DC | PRN

## 2012-01-04 MED ORDER — PANTOPRAZOLE SODIUM 20 MG PO TBEC
20.0000 mg | DELAYED_RELEASE_TABLET | Freq: Every day | ORAL | Status: DC
  Filled 2012-01-04 (×4): qty 1

## 2012-01-04 MED ORDER — EPHEDRINE SULFATE 50 MG/ML IJ SOLN
INTRAMUSCULAR | Status: DC | PRN
  Administered 2012-01-04 (×3): 10 mg via INTRAVENOUS

## 2012-01-04 MED ORDER — METHYLERGONOVINE MALEATE 0.2 MG/ML IJ SOLN
INTRAMUSCULAR | Status: AC
  Filled 2012-01-04: qty 1

## 2012-01-04 MED ORDER — KETOROLAC TROMETHAMINE 30 MG/ML IJ SOLN: 30.0000 mg | Freq: Four times a day (QID) | INTRAMUSCULAR | Status: AC | PRN

## 2012-01-04 SURGICAL SUPPLY — 37 items
APL SKNCLS STERI-STRIP NONHPOA (GAUZE/BANDAGES/DRESSINGS) ×2
BENZOIN TINCTURE PRP APPL 2/3 (GAUZE/BANDAGES/DRESSINGS) ×3 IMPLANT
CLOTH BEACON ORANGE TIMEOUT ST (SAFETY) ×3 IMPLANT
CONTAINER PREFILL 10% NBF 15ML (MISCELLANEOUS) ×4 IMPLANT
DRAIN JACKSON PRT FLT 10 (DRAIN) IMPLANT
DRAPE LG THREE QUARTER DISP (DRAPES) ×1 IMPLANT
DRSG OPSITE POSTOP 4X10 (GAUZE/BANDAGES/DRESSINGS) ×2 IMPLANT
DURAPREP 26ML APPLICATOR (WOUND CARE) ×1 IMPLANT
ELECT REM PT RETURN 9FT ADLT (ELECTROSURGICAL) ×3
ELECTRODE REM PT RTRN 9FT ADLT (ELECTROSURGICAL) ×2 IMPLANT
EVACUATOR SILICONE 100CC (DRAIN) IMPLANT
EXTRACTOR VACUUM M CUP 4 TUBE (SUCTIONS) IMPLANT
GLOVE BIO SURGEON STRL SZ 6.5 (GLOVE) ×5 IMPLANT
GLOVE BIOGEL PI IND STRL 7.0 (GLOVE) ×2 IMPLANT
GLOVE BIOGEL PI INDICATOR 7.0 (GLOVE) ×1
GOWN PREVENTION PLUS LG XLONG (DISPOSABLE) ×9 IMPLANT
KIT ABG SYR 3ML LUER SLIP (SYRINGE) ×2 IMPLANT
NDL HYPO 25X5/8 SAFETYGLIDE (NEEDLE) IMPLANT
NEEDLE HYPO 25X5/8 SAFETYGLIDE (NEEDLE) ×3 IMPLANT
NS IRRIG 1000ML POUR BTL (IV SOLUTION) ×5 IMPLANT
PACK C SECTION WH (CUSTOM PROCEDURE TRAY) ×3 IMPLANT
PAD OB MATERNITY 4.3X12.25 (PERSONAL CARE ITEMS) ×2 IMPLANT
RTRCTR C-SECT PINK 25CM LRG (MISCELLANEOUS) IMPLANT
SLEEVE SCD COMPRESS KNEE MED (MISCELLANEOUS) ×4 IMPLANT
STAPLER VISISTAT 35W (STAPLE) IMPLANT
STRIP CLOSURE SKIN 1/2X4 (GAUZE/BANDAGES/DRESSINGS) ×3 IMPLANT
SUT CHROMIC 0 CT 1 (SUTURE) ×3 IMPLANT
SUT MNCRL AB 3-0 PS2 27 (SUTURE) ×2 IMPLANT
SUT PLAIN 2 0 (SUTURE) ×6
SUT PLAIN 2 0 XLH (SUTURE) ×3 IMPLANT
SUT PLAIN ABS 2-0 CT1 27XMFL (SUTURE) ×4 IMPLANT
SUT SILK 2 0 SH (SUTURE) IMPLANT
SUT VIC AB 0 CTX 36 (SUTURE) ×15
SUT VIC AB 0 CTX36XBRD ANBCTRL (SUTURE) ×9 IMPLANT
TOWEL OR 17X24 6PK STRL BLUE (TOWEL DISPOSABLE) ×9 IMPLANT
TRAY FOLEY CATH 14FR (SET/KITS/TRAYS/PACK) ×1 IMPLANT
WATER STERILE IRR 1000ML POUR (IV SOLUTION) ×1 IMPLANT

## 2012-01-04 NOTE — Progress Notes (Signed)
Narrative note: Pt received epidural around 0300, and was comfortable.  FHR w/ continued intermittent earlies/variables w/ ctxs, that would improve w/ position change, then reoccur.  At 0422, IUPC inserted easily, large amt of clear fluid noted on pt's pad at time of exam.  Cx 4.5/80/-1.  Ctxs q 2-4 min spontaneously.  Left bedside to go to MAU to see a pt, and RN's called at 0436 to report prolonged decel and HR in 70s.  I returned to pt's room quickly and pt in Tredelenberg on H-N-K, oxygen ox, and IVF bolusing.  Amnioinfusion was getting hooked up.  Pt given a dose of Terbutaline in attempt to space ctxs.  At 0439, FSE placed and FHR briefly up to 115 w/ scalp stim, before returning to 90's.  FSE tracing at 0444, and HR in 80s & 90s.  Emergency c/s rec'd to pt for fetal bradycardia, and pt and husband agreeable.  When transporting pt to OR around 0447, FHR in 60s.  With Dr. Normand Sloop en route from home, Dr. Penne Lash called to start cesarean section with me.   Rexene Edison, CNM 01/04/12 [phone] (Late entry)

## 2012-01-04 NOTE — Progress Notes (Signed)
Subjective: Postpartum Day 0: Cesarean Delivery due to [facility]. Patient in PACU--doing well, pleased that baby is doing well. Feeding:  Breast Contraceptive plan:  BTL with C/S  Objective: Vital signs in last 24 hours: Temp:  [97.5 F (36.4 C)-98.1 F (36.7 C)] 97.5 F (36.4 C) (12/28 0624) Pulse Rate:  [64-81] 81  (12/28 0700) Resp:  [15-21] 21  (12/28 0700) BP: (109-122)/(61-81) 109/63 mmHg (12/28 0700) SpO2:  [97 %-99 %] 98 % (12/28 0700) Weight:  [153 lb (69.4 kg)] 153 lb (69.4 kg) (12/27 2023)  Physical Exam:  General: alert Lochia: appropriate Uterine Fundus: firm Incision: Dressing CDI DVT Evaluation: No evidence of DVT seen on physical exam. Negative Homan's sign. JP drain:   NA   Basename 01/03/12 2218  HGB 12.7  HCT 35.7*    Assessment/Plan: Status post Cesarean section day 0--early post-op. Continue current care.     Nigel Bridgeman 01/04/2012, 7:18 AM

## 2012-01-04 NOTE — Anesthesia Preprocedure Evaluation (Addendum)
Anesthesia Evaluation  Patient identified by MRN, date of birth, ID band Patient awake    Reviewed: Allergy & Precautions, H&P , NPO status , Patient's Chart, lab work & pertinent test results, reviewed documented beta blocker date and time   History of Anesthesia Complications (+) DIFFICULT IV STICK / SPECIAL LINE  Airway Mallampati: III TM Distance: >3 FB Neck ROM: full    Dental  (+) Teeth Intact   Pulmonary neg pulmonary ROS,  breath sounds clear to auscultation        Cardiovascular negative cardio ROS  Rhythm:regular Rate:Normal     Neuro/Psych  Headaches, negative psych ROS   GI/Hepatic Neg liver ROS, Many abdominal surgeries during NICU stay due to prematurity/bowel perf/colostomy    Endo/Other  diabetes (diet controlled), Gestational  Renal/GU negative Renal ROS     Musculoskeletal   Abdominal   Peds  (+) premature delivery and NICU stayGastroesophagael problems Hematology negative hematology ROS (+)   Anesthesia Other Findings   Reproductive/Obstetrics (+) Pregnancy (h/o c/s x1, attempting VBAC - fetal bradycardia --> STAT C/S)                          Anesthesia Physical Anesthesia Plan  ASA: II and emergent  Anesthesia Plan: Epidural   Post-op Pain Management:    Induction:   Airway Management Planned:   Additional Equipment:   Intra-op Plan:   Post-operative Plan:   Informed Consent: I have reviewed the patients History and Physical, chart, labs and discussed the procedure including the risks, benefits and alternatives for the proposed anesthesia with the patient or authorized representative who has indicated his/her understanding and acceptance.     Plan Discussed with: Surgeon and CRNA  Anesthesia Plan Comments:        Anesthesia Quick Evaluation

## 2012-01-04 NOTE — Anesthesia Procedure Notes (Signed)
Epidural Patient location during procedure: OB Start time: 01/04/2012 2:52 AM  Staffing Performed by: anesthesiologist   Preanesthetic Checklist Completed: patient identified, site marked, surgical consent, pre-op evaluation, timeout performed, IV checked, risks and benefits discussed and monitors and equipment checked  Epidural Patient position: sitting Prep: site prepped and draped and DuraPrep Patient monitoring: continuous pulse ox and blood pressure Approach: midline Injection technique: LOR air  Needle:  Needle type: Tuohy  Needle gauge: 17 G Needle length: 9 cm and 9 Needle insertion depth: 5 cm cm Catheter type: closed end flexible Catheter size: 19 Gauge Catheter at skin depth: 10 cm Test dose: negative  Assessment Events: blood not aspirated, injection not painful, no injection resistance, negative IV test and no paresthesia  Additional Notes Discussed risk of headache, infection, bleeding, nerve injury and failed or incomplete block.  Patient voices understanding and wishes to proceed.   Epidural placed easily on first attempt.  Patient tolerated procedure well with no apparent complications.  Jasmine December, MDReason for block:procedure for pain

## 2012-01-04 NOTE — Op Note (Signed)
Cesarean Section Procedure Note   Alison Reed  01/03/2012 - 01/04/2012  Indications: Fetal Distress   Pre-operative Diagnosis: * fetal distress attempting VBAC  Post-operative Diagnosis: Same   Surgeon  Grant Ruts MD< Avion Kutzer  Assistants: [name] CNM  Anesthesia: epidural   Procedure Details:  The patient was taken to the OR for a stat cesarean section for fetal distress.  The beginning of the surgery and delivery was done by Dr Rollen Sox.   Delivered from cephalic presentation was a  pound Female with Apgar scores of 8 at one minute and 9 at five minutes. Cord ph was sent the umbilical cord was clamped and cut cord blood was obtained for evaluation. The placenta was removed and sent to pathology and appeared normal. The uterine outline, tubes and ovaries appeared normal}. The uterine incision was closed with running locked sutures of 0Vicryl. A second layer of 0 vicryl was used to imbricate the uterus. I started the procedure at the second layer on the uterus.   The patients left fallopian tube was grasped at the mid ishtmic portion with babcock clamp, ligated with 2-0 plain and excised.  The patients right fallopian tube was followed out to the fimbriated end.  The mid isthmic portion of the tube was ligated with 2-0 plain and excised.  Both portions of tubes was sent to pathology.     Hemostasis was observed. Lavage was carried out until clear. The fascia was then reapproximated with running sutures of 0Vicryl. The subcuticular closure was performed using 2-0plain gut. There was a bleeder on the right side of  The subcutaneous tissue which was made hemostatic with bovie cautery.   The skin was closed with 3-55monocryl.   Instrument, sponge, and needle counts were correct prior the abdominal closure and were correct at the conclusion of the case. An abdominal x ray was done because a count could not be done.     Findings:   Estimated Blood Loss: 700 ml  Total IV Fluids:    Urine Output: 400CC OF clear urine  Specimens: @ORSPECIMEN @   Complications: no complications  Disposition: PACU - hemodynamically stable.   Maternal Condition: stable   Baby condition / location:  nursery-stable  Attending Attestation: I was present and scrubbed for the key portions of the procedure.   Signed: Surgeon(s): Michael Litter, MD

## 2012-01-04 NOTE — Anesthesia Postprocedure Evaluation (Signed)
Anesthesia Post Note  Patient: Alison Reed  Procedure(s) Performed: Procedure(s) (LRB): CESAREAN SECTION WITH BILATERAL TUBAL LIGATION ()  Anesthesia type: Epidural  Patient location: PACU  Post pain: Pain level controlled  Post assessment: Post-op Vital signs reviewed  Last Vitals:  Filed Vitals:   01/04/12 0624  BP: 122/61  Pulse: 70  Temp: 36.4 C  Resp: 15    Post vital signs: stable  Level of consciousness: awake  Complications: No apparent anesthesia complications

## 2012-01-04 NOTE — OR Nursing (Signed)
Patient to OR Room 1 for Stat Cesarean Section for Fetal Distress.  Surgery started and baby delivered by faculty physician Dr. Penne Lash.   Dr. Normand Sloop arrived at 0509 and finished procedure.  Uterus firm at umbilicus expressed approximately 50 mls on uterine massage

## 2012-01-04 NOTE — Transfer of Care (Signed)
Immediate Anesthesia Transfer of Care Note  Patient: Alison Reed  Procedure(s) Performed: Procedure(s) (LRB) with comments: CESAREAN SECTION (N/A)  Patient Location: PACU  Anesthesia Type:Epidural  Level of Consciousness: awake, alert , oriented and patient cooperative  Airway & Oxygen Therapy: Patient Spontanous Breathing  Post-op Assessment: Report given to PACU RN, Post -op Vital signs reviewed and stable and Patient moving all extremities X 4  Post vital signs: Reviewed and stable  Complications: No apparent anesthesia complications

## 2012-01-04 NOTE — Progress Notes (Signed)
Subjective: Resting off and On s/p Bendaryl.  Objective: BP 111/66  Pulse 65  Temp 98.1 F (36.7 C) (Oral)  Resp 18  Ht 4\' 11"  (1.499 m)  Wt 153 lb (69.4 kg)  BMI 30.90 kg/m2  LMP 03/20/2011      FHT:  FHR: 135 bpm, variability: moderate,  accelerations:  Present,  decelerations:  Present recurrent earlies/variables s/p SROM UC:   irregular, every 3-9 minutes SVE:   Dilation: 3.5 Effacement (%): 60 Station: -2 Exam by:: [name], CNM  Labs: Lab Results  Component Value Date   WBC 9.3 01/03/2012   HGB 12.7 01/03/2012   HCT 35.7* 01/03/2012   MCV 87.1 01/03/2012   PLT 188 01/03/2012    Assessment / Plan: 1. [redacted]w[redacted]d 2. IOL due to GDM-diet 3. TOLAC  Labor: Progressing normally Preeclampsia:  no signs or symptoms of toxicity Fetal Wellbeing:  Category II Pain Control:  Labor support without medications I/D:  n/a Anticipated MOD:  NSVD 1. Position changes/ amnioinfusion prn   Alison Reed H 01/04/2012, 2:04 AM

## 2012-01-04 NOTE — Progress Notes (Signed)
Subjective: Drowsy s/p Benadryl.  SROM around 2340, clear fluid.    Objective: BP 122/81  Pulse 67  Temp 98.1 F (36.7 C) (Oral)  Resp 20  Ht 4\' 11"  (1.499 m)  Wt 153 lb (69.4 kg)  BMI 30.90 kg/m2  LMP 03/20/2011      FHT:  FHR: 135 bpm, variability: moderate,  accelerations:  Present,  decelerations:  Present intermittent mild to mod variable UC:   irregular, every 2-5 minutes SVE:   Dilation: 3.5 Effacement (%): 60 Station: -2 Exam by:: [name], CNM Deferred at present Labs: Lab Results  Component Value Date   WBC 9.3 01/03/2012   HGB 12.7 01/03/2012   HCT 35.7* 01/03/2012   MCV 87.1 01/03/2012   PLT 188 01/03/2012   .[name] CBG (last 3)   Basename 01/04/12 0034 01/03/12 2224  GLUCAP 89 82     Assessment / Plan: 1. 101w1d 2. IOL due to GDM-diet 3. TOLAC 4. GBS neg  Labor: Progressing normally Preeclampsia:  no signs or symptoms of toxicity Fetal Wellbeing:  Category II Pain Control:  Labor support without medications I/D:  n/a Anticipated MOD:  NSVD 1. Continue current POC, and c/w MD prn Earline Stiner H 01/04/2012, 12:40 AM

## 2012-01-04 NOTE — Progress Notes (Signed)
Patient ID: Lisabeth Pick, female   DOB: August 26, 1975, 36 y.o.   MRN: [id number] S/O:  Pt received 2nd dose of Fentanyl around 0115, w/ little relief, and breathing w/ most ctxs         currently.  Rec'd pt proceed w/ epidural and she is agreeable.  FHR w/ continued intermittent        earlies/variables w/ ctxs; resolve w/ position changes and then return periodically.  Ctxs about q 3-5 min.  AVSS.  A/P: early labor s/p foley bulb and SROM, GDM-diet controlled, Cat II FHT, GBS neg         Proceed w/ epidural and recheck after comfortable and likely proceed w/ Pitocin/IUPC, possibly          amnioinfusion.  C/w MD prn  C. Willard, [place] 01/04/12 [phone]

## 2012-01-05 LAB — CBC
HCT: 27.7 % — ABNORMAL LOW (ref 36.0–46.0)
Hemoglobin: 9.6 g/dL — ABNORMAL LOW (ref 12.0–15.0)
MCHC: 34.7 g/dL (ref 30.0–36.0)
RDW: 13.4 % (ref 11.5–15.5)
WBC: 9.6 10*3/uL (ref 4.0–10.5)

## 2012-01-05 LAB — GLUCOSE, CAPILLARY: Glucose-Capillary: 72 mg/dL (ref 70–99)

## 2012-01-05 NOTE — Progress Notes (Addendum)
Subjective: Postpartum Day 1: Cesarean Delivery due to [facility], previous C/S, attempted VBAC Patient up ad lib, reports no syncope or dizziness.  Foley removed, with spontaneous voids since. Feeding:  Bottle Contraceptive plan:  BTL with C/S  Objective: Vital signs in last 24 hours: Temp:  [97.3 F (36.3 C)-98.4 F (36.9 C)] 98 F (36.7 C) (12/29 0500) Pulse Rate:  [60-76] 70  (12/29 0500) Resp:  [16-20] 18  (12/29 0500) BP: (87-104)/(56-65) 90/60 mmHg (12/29 0500) SpO2:  [95 %-99 %] 96 % (12/29 0500)  Physical Exam:  General: alert Lochia: appropriate Uterine Fundus: firm Incision: Honeycomb dressing saturated at least 1/2 with old drainage--no active bleeding. DVT Evaluation: No evidence of DVT seen on physical exam. Negative Homan's sign. JP drain:   NA  Soiled honeycomb dressing removed--no active bleeding or drainage noted. Steristrips intact, with old drainage--left in place.   Basename 01/05/12 0510 01/03/12 2218  HGB 9.6* 12.7  HCT 27.7* 35.7*    Assessment/Plan: Status post Cesarean section day 1 Doing well postoperatively.  Continue current care.   Nigel Bridgeman 01/05/2012, 8:26 AM

## 2012-01-05 NOTE — Anesthesia Postprocedure Evaluation (Signed)
  Anesthesia Post-op Note  Patient: Alison Reed  Procedure(s) Performed: Procedure(s) (LRB) with comments: CESAREAN SECTION WITH BILATERAL TUBAL LIGATION () - Repeat Cesarean Section Delivery Baby Girl @ 0503, apgars 8/9, Bilateral Tubal Ligation  Patient Location: Mother/Baby  Anesthesia Type:Epidural  Level of Consciousness: awake, alert  and oriented  Airway and Oxygen Therapy: Patient Spontanous Breathing  Post-op Pain: mild  Post-op Assessment: Post-op Vital signs reviewed, Patient's Cardiovascular Status Stable, No headache, No backache, No residual numbness and No residual motor weakness  Post-op Vital Signs: Reviewed and stable  Complications: No apparent anesthesia complications

## 2012-01-05 NOTE — Addendum Note (Signed)
Addendum  created 01/05/12 1191 by Shanon Payor, CRNA   Modules edited:Notes Section

## 2012-01-05 NOTE — Progress Notes (Signed)
Patient ID: Lisabeth Pick, female   DOB: July 20, 1975, 36 y.o.   MRN: [id number]  Called to OR to start c/s for fetal bradycardia not responsive to maternal resuscitation.  FH had ben low for 12-15 mins when I was asked to intervene.  Pt was consented for c/s.   The risks of cesarean section discussed with the patient included but were not limited to: bleeding which may require transfusion or reoperation; infection which may require antibiotics; injury to bowel, bladder, ureters or other surrounding organs; injury to the fetus; need for additional procedures including hysterectomy in the event of a life-threatening hemorrhage; placental abnormalities with subsequent pregnancies, incisional problems, thromboembolic phenomenon and other postoperative/anesthesia complications. The patient concurred with the proposed plan, giving informed written consent for the procedure.    PROCEDURE IN DETAIL:  The patient had sequential compression devices applied to her lower extremities.  A foley catheter was placed into her bladder and attached to constant gravity.  After an adequate timeout was performed, a Pfannenstiel skin incision was made with scalpel and carried through to the underlying layer of fascia. The fascia was incised in the midline and this incision was extended bilaterally using the Mayo scissors. Kocher clamps were applied to the superior aspect of the fascial incision and the underlying rectus muscles were dissected off bluntly. A similar process was carried out on the inferior aspect of the facial incision. The rectus muscles were separated in the midline bluntly and the peritoneum was entered bluntly.   A transverse hysterotomy was made with a scalpel and extended bilaterally bluntly. The bladder blade was then removed. The infant was successfully delivered, and cord was clamped and cut and infant was handed over to awaiting neonatology team. Uterine massage was then administered and the placenta  delivered intact with three-vessel cord. The uterus was cleared of clot and debris.  The hysterotomy was closed with 0 vicryl.  At this point Dr. Normand Sloop arrived and she finished the c/s.  Details are in her operative note.  LEGGETT,KELLY H. 12:18 PM (late entry)

## 2012-01-06 DIAGNOSIS — O9081 Anemia of the puerperium: Secondary | ICD-10-CM | POA: Diagnosis not present

## 2012-01-06 HISTORY — DX: Anemia of the puerperium: O90.81

## 2012-01-06 LAB — GLUCOSE, CAPILLARY: Glucose-Capillary: 75 mg/dL (ref 70–99)

## 2012-01-06 MED ORDER — OXYCODONE-ACETAMINOPHEN 5-325 MG PO TABS: 1.0000 | ORAL_TABLET | ORAL | Status: DC | PRN

## 2012-01-06 MED ORDER — FERROUS SULFATE 325 (65 FE) MG PO TABS: 325.0000 mg | ORAL_TABLET | Freq: Every day | ORAL | Status: DC

## 2012-01-06 MED ORDER — IBUPROFEN 600 MG PO TABS: 600.0000 mg | ORAL_TABLET | Freq: Four times a day (QID) | ORAL | Status: DC | PRN

## 2012-01-06 NOTE — Discharge Summary (Signed)
Obstetric Discharge Summary Date of Admission: 01/03/12 at [redacted] weeks gestation Reason for Admission: induction of labor Prenatal Procedures: ultrasound Intrapartum Procedures: cesarean: low cervical, transverse, tubal ligation and epidural Postpartum Procedures: daily fasting cbg's Complications-Operative and Postpartum: none Hemoglobin  Date Value Range Status  01/05/2012 9.6* 12.0 - 15.0 g/dL Final     DELTA CHECK NOTED     REPEATED TO VERIFY     HCT  Date Value Range Status  01/05/2012 27.7* 36.0 - 46.0 % Final  .. CBG (last 3)   Basename 01/06/12 0754 01/05/12 0639 01/04/12 0636  GLUCAP 75 72 146*    Hospital Course: Admitted evening of 01/03/12 for IOL at 40 weeks for GDM-diet controlled.  Pt desired TOLAC; cx=1/50%/-1 on admission.  Foley bulb placed around 2100; it spontaneously d/c'd just before 2300 and cx 3-4cm.  Labor progressed spontaneously from there, and at 2316, SROM, clear fluid.  Pt received epidural around 0300.  FHT w/ intermittent earlies/variables which improved w/ interventions, but around 0415 rec'd IUPC and amnioinfusion; cx around 4 cm.  After placing catheter, called back to room w/in a few minutes for prolonged decel, and fetal bradycardia noted thereafter, and despite interventions, did not resolve.  Emergency c/s ensued, assisted first by Dr. Elsie Lincoln until Dr. Redmond Baseman arrival.  Pt did have BTL at time of delivery, which she had previously consented for during pregnancy if a repeat c/s was done for delivery.  Pt and newborn tolerated procedure well and both have had an uncomplicated recovery on M/B, leading to pt requesting early d/c today on PPD#2.  She is formula-feeding.  Pt has been up ad lib, no dizziness, tol po.  No BM yet, but flatus.  cbg's PP all WNL, and pt diet-controlled during pregnancy.    Physical Exam:  General: alert, cooperative, appears stated age, no distress and pleasant Lochia: appropriate, rubra Uterine Fundus: firm, below  umbilicus Incision: healing well, no significant drainage, honeycomb dsg still intact DVT Evaluation: No evidence of DVT seen on physical exam. Negative Homan's sign. No significant calf/ankle edema.  Discharge Diagnoses: Term Pregnancy-delivered, Failed induction and s/p emergency c/s for fetal bradycardia; s/p repeat cesarean; mild PP anemia; formula feeding; GDM-diet controlled; s/p BTL; Failed TOLAC.  Discharge Information: Date: 01/06/2012 PPD#2 Activity: pelvic rest Diet: iron-rich Medications: PNV, Ibuprofen, Colace, Iron and Percocet Condition: stable Instructions: refer to practice specific booklet Discharge to: home Follow-up Information    Follow up with [facility]. On 02/11/2012. (or call as needed with any questions or concerns)    Contact information:   [address] [place] [address] [phone]         Newborn Data: Live born female 01/04/12 "Santina Evans", a.k.a Jae Dire or Katie (delivery providers: Dr. Elsie Lincoln, Dr. Jaymes Graff, & Denny Levy, CNM) Birth Weight: 6 lb 4.5 oz (2850 g) APGAR: 8, 9  Home with mother.  Lielle Vandervort H 01/06/2012, 11:25 AM

## 2012-01-08 ENCOUNTER — Encounter (HOSPITAL_COMMUNITY): Payer: Self-pay | Admitting: Obstetrics and Gynecology

## 2012-01-15 ENCOUNTER — Telehealth: Payer: Self-pay | Admitting: Obstetrics and Gynecology

## 2012-01-15 NOTE — Telephone Encounter (Signed)
Tc to pt per telephone call. Pt wants to know when she can drive s/p c-section on 40/98/11. Informed pt to wait 2 wks before driving. Pt states,"incision looks good at this time". Pt voices understanding.

## 2012-01-31 ENCOUNTER — Telehealth: Payer: Self-pay | Admitting: Obstetrics and Gynecology

## 2012-01-31 NOTE — Telephone Encounter (Signed)
Spoke with pt rgd msg. Made a app with [facility] 02/03/2012 @ 2:45 for a incision check.

## 2012-02-03 ENCOUNTER — Encounter: Payer: Self-pay | Admitting: Obstetrics and Gynecology

## 2012-02-03 ENCOUNTER — Ambulatory Visit: Payer: BC Managed Care – PPO | Admitting: Obstetrics and Gynecology

## 2012-02-03 NOTE — Progress Notes (Signed)
HISTORY OF PRESENT ILLNESS  [name] is a 37 y.o. year old female,G3P1112, who presents for a problem visit. The patient had a cesarean section on December 27.  She also had a tubal sterilization procedure.  Subjective:  She is doing well.  She is sometimes stressed caring for her 2 children.  She has had some concern about her incision.  There is no bleeding at this time.  She is bottlefeeding.  Objective:  Temp 97.7 F (36.5 C) (Oral)  Wt 137 lb (62.143 kg)  Breastfeeding? No   General: no distress GI: incision: dry and small area of redness to the left of the midline and Silver nitrate applied.  External genitalia: normal general appearance Vaginal: normal without tenderness, induration or masses Cervix: nontender Adnexa: normal bimanual exam Uterus: upper limits normal size  E PDS: 6  Assessment:  Doing well status post cesarean section and tubal sterilization procedure  Plan:  Slowly returned to normal activities.  Support offered.  Return to office in 4 month(s) for annual exam.   Alison Reed M.D.  02/03/2012 3:36 PM

## 2012-02-11 ENCOUNTER — Encounter: Payer: BC Managed Care – PPO | Admitting: Obstetrics and Gynecology

## 2013-01-06 ENCOUNTER — Telehealth: Payer: Self-pay

## 2013-01-06 NOTE — Telephone Encounter (Signed)
We have not seen patient, unfortunately we can not do this without visit. Called to advise.

## 2013-01-06 NOTE — Telephone Encounter (Signed)
PT STATES HER DAUGHTER WAS DIAGNOSED WITH THE FLU AND WOULD LIKE TO HAVE SOME TAMIFLU CALLED IN FOR HER PLEASE CALL [phone]   [facility] [address]

## 2013-03-04 ENCOUNTER — Encounter: Payer: Self-pay | Admitting: Physician Assistant

## 2013-04-01 ENCOUNTER — Encounter: Payer: Self-pay | Admitting: Physician Assistant

## 2013-04-29 ENCOUNTER — Ambulatory Visit (INDEPENDENT_AMBULATORY_CARE_PROVIDER_SITE_OTHER): Payer: BC Managed Care – PPO | Admitting: Physician Assistant

## 2013-04-29 ENCOUNTER — Encounter: Payer: Self-pay | Admitting: Physician Assistant

## 2013-04-29 VITALS — BP 92/71 | HR 71 | Temp 98.1°F | Resp 16 | Ht 59.0 in | Wt 133.0 lb

## 2013-04-29 DIAGNOSIS — Z0000 Encounter for general adult medical examination without abnormal findings: Secondary | ICD-10-CM

## 2013-04-29 DIAGNOSIS — D649 Anemia, unspecified: Secondary | ICD-10-CM

## 2013-04-29 LAB — LIPID PANEL
CHOL/HDL RATIO: 2.8 ratio
Cholesterol: 136 mg/dL (ref 0–200)
HDL: 48 mg/dL (ref >39)
LDL CALC: 72 mg/dL (ref 0–99)
Triglycerides: 82 mg/dL (ref <150)
VLDL: 16 mg/dL (ref 0–40)

## 2013-04-29 LAB — POCT URINALYSIS DIPSTICK
Bilirubin, UA: NEGATIVE
Blood, UA: NEGATIVE
Glucose, UA: NEGATIVE
KETONES UA: NEGATIVE
LEUKOCYTES UA: NEGATIVE
NITRITE UA: NEGATIVE
PROTEIN UA: NEGATIVE
Spec Grav, UA: 1.015
UROBILINOGEN UA: 0.2
pH, UA: 6

## 2013-04-29 LAB — CBC WITH DIFFERENTIAL/PLATELET
BASOS ABS: 0 10*3/uL (ref 0.0–0.1)
BASOS PCT: 0 % (ref 0–1)
Eosinophils Absolute: 0.1 10*3/uL (ref 0.0–0.7)
Eosinophils Relative: 1 % (ref 0–5)
HCT: 40.3 % (ref 36.0–46.0)
Hemoglobin: 13.8 g/dL (ref 12.0–15.0)
LYMPHS PCT: 35 % (ref 12–46)
Lymphs Abs: 2.1 10*3/uL (ref 0.7–4.0)
MCH: 30.6 pg (ref 26.0–34.0)
MCHC: 34.2 g/dL (ref 30.0–36.0)
MCV: 89.4 fL (ref 78.0–100.0)
Monocytes Absolute: 0.5 10*3/uL (ref 0.1–1.0)
Monocytes Relative: 8 % (ref 3–12)
NEUTROS ABS: 3.3 10*3/uL (ref 1.7–7.7)
NEUTROS PCT: 56 % (ref 43–77)
PLATELETS: 304 10*3/uL (ref 150–400)
RBC: 4.51 MIL/uL (ref 3.87–5.11)
RDW: 13.4 % (ref 11.5–15.5)
WBC: 5.9 10*3/uL (ref 4.0–10.5)

## 2013-04-29 LAB — POCT UA - MICROSCOPIC ONLY
Casts, Ur, LPF, POC: NEGATIVE
Crystals, Ur, HPF, POC: NEGATIVE
MUCUS UA: NEGATIVE
YEAST UA: NEGATIVE

## 2013-04-29 LAB — COMPREHENSIVE METABOLIC PANEL
ALBUMIN: 4.3 g/dL (ref 3.5–5.2)
ALK PHOS: 71 U/L (ref 39–117)
ALT: 21 U/L (ref 0–35)
AST: 20 U/L (ref 0–37)
BUN: 13 mg/dL (ref 6–23)
CALCIUM: 9.5 mg/dL (ref 8.4–10.5)
CHLORIDE: 103 meq/L (ref 96–112)
CO2: 28 mEq/L (ref 19–32)
Creat: 0.71 mg/dL (ref 0.50–1.10)
Glucose, Bld: 87 mg/dL (ref 70–99)
POTASSIUM: 3.9 meq/L (ref 3.5–5.3)
Sodium: 140 mEq/L (ref 135–145)
Total Bilirubin: 0.3 mg/dL (ref 0.2–1.2)
Total Protein: 6.5 g/dL (ref 6.0–8.3)

## 2013-04-29 LAB — TSH: TSH: 0.908 u[IU]/mL (ref 0.350–4.500)

## 2013-04-29 NOTE — Patient Instructions (Signed)
Since we performed a breast exam today, and you do not need cervical cancer screening again until 2018, it is OK to cancel the GYN appointment you have scheduled for next week.  I will contact you with your lab results as soon as they are available.   If you have not heard from me in 2 weeks, please contact me.  The fastest way to get your results is to register for My Chart (see the instructions on the last page of this printout).  Keeping You Healthy  Get These Tests 1. Blood Pressure- Have your blood pressure checked once a year by your health care provider.  Normal blood pressure is 120/80. 2. Weight- Have your body mass index (BMI) calculated to screen for obesity.  BMI is measure of body fat based on height and weight.  You can also calculate your own BMI at https://www.west-esparza.com/www.nhlbisupport.com/bmi/. 3. Cholesterol- Have your cholesterol checked every 5 years starting at age 36 then yearly starting at age 36. 4. Chlamydia, HIV, and other sexually transmitted diseases- Get screened every year until age 36, then within three months of each new sexual provider. 5. Pap Smear- Every 1-5 years; discuss with your health care provider. 6. Mammogram- Every other year starting at age 36  Take these medicines  Calcium with Vitamin D-Your body needs 1200 mg of Calcium each day and 320 156 5282 IU of Vitamin D daily.  Your body can only absorb 500 mg of Calcium at a time so Calcium must be taken in 2 or 3 divided doses throughout the day.  Multivitamin with folic acid- Once daily if it is possible for you to become pregnant.  Get these Immunizations  Gardasil-Series of three doses; prevents HPV related illness such as genital warts and cervical cancer.  Menactra-Single dose; prevents meningitis.  Tetanus shot- Every 10 years.  Flu shot-Every year.  Take these steps 1. Do not smoke-Your healthcare provider can help you quit.  For tips on how to quit go to www.smokefree.gov or call 1-800 QUITNOW. 2. Be  physically active- Exercise 5 days a week for at least 30 minutes.  If you are not already physically active, start slow and gradually work up to 30 minutes of moderate physical activity.  Examples of moderate activity include walking briskly, dancing, swimming, bicycling, etc. 3. Breast Cancer- A self breast exam every month is important for early detection of breast cancer.  For more information and instruction on self breast exams, ask your healthcare provider or [place].eswww.womenshealth.gov/faq/breast-self-exam.cfm. 4. Eat a healthy diet- Eat a variety of healthy foods such as fruits, vegetables, whole grains, low fat milk, low fat cheeses, yogurt, lean meats, poultry and fish, beans, nuts, tofu, etc.  For more information go to www. Thenutritionsource.org 5. Drink alcohol in moderation- Limit alcohol intake to one drink or less per day. Never drink and drive. 6. Depression- Your emotional health is as important as your physical health.  If you're feeling down or losing interest in things you normally enjoy please talk to your healthcare provider about being screened for depression. 7. Dental visit- Brush and floss your teeth twice daily; visit your dentist twice a year. 8. Eye doctor- Get an eye exam at least every 2 years. 9. Helmet use- Always wear a helmet when riding a bicycle, motorcycle, rollerblading or skateboarding. 10. Safe sex- If you may be exposed to sexually transmitted infections, use a condom. 11. Seat belts- Seat belts can save your live; always wear one. 12. Smoke/Carbon Monoxide detectors- These detectors need to  be installed on the appropriate level of your home. Replace batteries at least once a year. 13. Skin cancer- When out in the sun please cover up and use sunscreen 15 SPF or higher. 14. Violence- If anyone is threatening or hurting you, please tell your healthcare provider.

## 2013-04-29 NOTE — Progress Notes (Signed)
I have examined this patient along with the student and agree.  

## 2013-04-29 NOTE — Progress Notes (Signed)
Subjective:    Patient ID: Alison Reed, female    DOB: 1975/12/02, 38 y.o.   MRN: [id number]  HPI  Patient Active Problem List   Diagnosis Date Noted  . Postpartum anemia 01/06/2012  . Anemia   . Hx of dysmenorrhea 04/16/2007  . History of rectal bleeding 02/06/2004   Past Surgical History  Procedure Laterality Date  . Wisdom tooth extraction    . Colostomy    . Cesarean section  10/30/2009  . Abdominal surgery      infant abdomen reconstruction  . Abdominal surgery      revise scar  . Cesarean section with bilateral tubal ligation  01/04/2012    Procedure: CESAREAN SECTION WITH BILATERAL TUBAL LIGATION;  Surgeon: [name], MD;  Location: [facility];  Service: Obstetrics;;  Repeat Cesarean Section Delivery Baby Girl @ 0503, apgars 8/9, Bilateral Tubal Ligation    Prior to Admission medications   Medication Sig Start Date End Date Taking? Authorizing Provider  cetirizine (ZYRTEC) 10 MG tablet Take 10 mg by mouth daily.   Yes Historical Provider, MD  sodium chloride (OCEAN) 0.65 % nasal spray Place 1 spray into the nose as needed. allergies   Yes Historical Provider, MD   Family history reviewed and updated with patient  Social history reviewed and updated with patient   38y.o female with anemia presents for her annual physical exam.  Pt is not fasting today.  No specific concerns or complaints today.  Last breast and pap test performed in 05/2011.  Cytology and HPV both negative.  Will have next pap test in 2018.  Review of Systems  Constitutional: Negative.   HENT: Negative.   Respiratory: Negative.   Cardiovascular: Negative.   Gastrointestinal: Negative.   Endocrine: Negative.   Genitourinary: Negative.   Musculoskeletal: Negative.   Skin: Negative for color change and rash.  Allergic/Immunologic: Positive for environmental allergies.  Neurological: Negative.   Hematological: Negative.        Objective:   Physical Exam  Constitutional: She is  oriented to person, place, and time. She appears well-developed and well-nourished. No distress.  BP 92/71  Pulse 71  Temp(Src) 98.1 F (36.7 C)  Resp 16  Ht 4\' 11"  (1.499 m)  Wt 133 lb (60.328 kg)  BMI 26.85 kg/m2  SpO2 97%   HENT:  Head: Normocephalic.  Right Ear: External ear normal.  Left Ear: External ear normal.  Nose: Nose normal.  Mouth/Throat: No oropharyngeal exudate.  Eyes: Conjunctivae are normal. Pupils are equal, round, and reactive to light.  Neck: Normal range of motion. No tracheal deviation present. No thyromegaly present.  Cardiovascular: Normal rate, regular rhythm, normal heart sounds and intact distal pulses.  Exam reveals no gallop and no friction rub.   No murmur heard. Pulmonary/Chest: Effort normal and breath sounds normal. No respiratory distress. She has no wheezes. She exhibits no tenderness.  Abdominal: Soft. Bowel sounds are normal. She exhibits no distension and no mass. There is no tenderness.  Genitourinary: No breast swelling, tenderness, discharge or bleeding.  Breasts without masses, tenderness, or adenopathy.  No nipple inversion or discharge bilat.  Musculoskeletal: Normal range of motion.  Lymphadenopathy:    She has no cervical adenopathy.  Neurological: She is alert and oriented to person, place, and time. She has normal reflexes. No cranial nerve deficit. Coordination normal.  Skin: Skin is warm and dry. No rash noted.  Psychiatric: She has a normal mood and affect. Her behavior is normal.  Visual Acuity Screening   Right eye Left eye Both eyes  Without correction: 20/40 20/25 20/20   With correction:      Results for orders placed in visit on 04/29/13  POCT URINALYSIS DIPSTICK      Result Value Ref Range   Color, UA yellow     Clarity, UA clear     Glucose, UA neg     Bilirubin, UA neg     Ketones, UA neg     Spec Grav, UA 1.015     Blood, UA neg     pH, UA 6.0     Protein, UA neg     Urobilinogen, UA 0.2     Nitrite,  UA neg     Leukocytes, UA Negative    POCT UA - MICROSCOPIC ONLY      Result Value Ref Range   WBC, Ur, HPF, POC 0-1     RBC, urine, microscopic 0-1     Bacteria, U Microscopic trace     Mucus, UA neg     Epithelial cells, urine per micros 17-23     Crystals, Ur, HPF, POC neg     Casts, Ur, LPF, POC neg     Yeast, UA neg          Assessment & Plan:   1. Annual physical exam No specific concerns today.  Urine normal.  Awaiting other lab results.  Will notify pt with results - POCT urinalysis dipstick - Lipid panel - POCT UA - Microscopic Only - TSH  2. Anemia Hemoglobin 9.6 and Hct 27.7 at office visit 1 year ago.  Pt is not fasting today.  Awaiting lab results.  - CBC with Differential - Comprehensive metabolic panel

## 2013-05-04 ENCOUNTER — Encounter: Payer: Self-pay | Admitting: Physician Assistant

## 2013-06-14 ENCOUNTER — Ambulatory Visit (INDEPENDENT_AMBULATORY_CARE_PROVIDER_SITE_OTHER): Payer: BC Managed Care – PPO | Admitting: Emergency Medicine

## 2013-06-14 VITALS — BP 112/68 | HR 71 | Temp 98.4°F | Resp 16 | Ht 61.5 in | Wt 133.6 lb

## 2013-06-14 DIAGNOSIS — J018 Other acute sinusitis: Secondary | ICD-10-CM

## 2013-06-14 DIAGNOSIS — J029 Acute pharyngitis, unspecified: Secondary | ICD-10-CM

## 2013-06-14 MED ORDER — PSEUDOEPHEDRINE-GUAIFENESIN ER 60-600 MG PO TB12: 1.0000 | ORAL_TABLET | Freq: Two times a day (BID) | ORAL | Status: AC

## 2013-06-14 MED ORDER — AMOXICILLIN-POT CLAVULANATE 875-125 MG PO TABS: 1.0000 | ORAL_TABLET | Freq: Two times a day (BID) | ORAL | Status: DC

## 2013-06-14 NOTE — Patient Instructions (Signed)

## 2013-06-14 NOTE — Progress Notes (Signed)
[facility] [address], [place] [address] [phone]  Date:  06/14/2013   Name:  Alison Reed   DOB:  [birth year]   MRN:  [id number]  PCP:  Janine Limbo, MD    Chief Complaint: Sore Throat, Muscle Pain and Otalgia   History of Present Illness:  Alison Reed is a 38 y.o. very pleasant female patient who presents with the following:  Ill with nasal congestion and purulent nasal drainage and post nasal drainage.  Has a  Sore throat and pain in ears.  Exposed to strep last week.  No fever but chilled.  Has myalgias and arthralgias. No cough wheezing or shortness of breath.  No improvement with over the counter medications or other home remedies. Denies other complaint or health concern today.   Patient Active Problem List   Diagnosis Date Noted  . Postpartum anemia 01/06/2012  . Anemia   . Hx of dysmenorrhea 04/16/2007  . History of rectal bleeding 02/06/2004    Past Medical History  Diagnosis Date  . History of chicken pox   . History of rectal bleeding 02/06/04  . Headache(784.0)     SINUS;ALLERGY RELATED  . Infection     YEAST;NOT FREQ  . PMS (premenstrual syndrome)   . Premature baby     @ 30 WKS;HAD SEVERAL SURGERIES;HAD COLOSTOMY  . Diabetes mellitus without complication   . Gestational diabetes     diet controlled  . Failed induction, delivered, current hospitalization 01/04/2012    Induction for GDM-diet controlled. Failed VBAC.  . Status post repeat low transverse cesarean section 01/04/2012    Emergency c/s for NRFHT; failed induction. Failed VBAC.  . Status post tubal ligation at time of delivery, current hospitalization 01/04/2012  . Status post repeat low transverse cesarean section 01/04/2012    Emergency c/s for NRFHT; failed induction. Failed VBAC.   [name] GDM (gestational diabetes mellitus) 10/21/2011    [place] q 2-4 weeks.   Pt needs NST 2x/wk or BPP weekly only if she needs meds to control BS   . History of  cesarean delivery - 36wks, in labor, breech - w [facility] 06/10/2011    2 layer closure per [facility]     Past Surgical History  Procedure Laterality Date  . Wisdom tooth extraction    . Colostomy    . Cesarean section  10/30/2009  . Abdominal surgery      infant abdomen reconstruction  . Abdominal surgery      revise scar  . Cesarean section with bilateral tubal ligation  01/04/2012    Procedure: CESAREAN SECTION WITH BILATERAL TUBAL LIGATION;  Surgeon: Michael Litter, MD;  Location: [facility];  Service: Obstetrics;;  Repeat Cesarean Section Delivery Baby Girl @ 0503, apgars 8/9, Bilateral Tubal Ligation    History  Substance Use Topics  . Smoking status: Never Smoker   . Smokeless tobacco: Never Used  . Alcohol Use: No     Comment: SOCIALLY OR ON HOLIDAYS ONLY ;2-3 GLASSES OF WINE/YEAR    Family History  Problem Relation Age of Onset  . Adopted: Yes  . Heart disease Maternal Grandmother   . Cancer Father   . Thyroid disease Maternal Grandmother     No Known Allergies  Medication list has been reviewed and updated.  Current Outpatient Prescriptions on File Prior to Visit  Medication Sig Dispense Refill  . cetirizine (ZYRTEC) 10 MG tablet Take 10 mg by mouth daily.      [name]  sodium chloride (OCEAN) 0.65 % nasal spray Place 1 spray into the nose as needed. allergies       No current facility-administered medications on file prior to visit.    Review of Systems:  As per HPI, otherwise negative.    Physical Examination: Filed Vitals:   06/14/13 1238  BP: 112/68  Pulse: 71  Temp: 98.4 F (36.9 C)  Resp: 16   Filed Vitals:   06/14/13 1238  Height: 5' 1.5" (1.562 m)  Weight: 133 lb 9.6 oz (60.601 kg)   Body mass index is 24.84 kg/(m^2). Ideal Body Weight: Weight in (lb) to have BMI = 25: 134.2  GEN: WDWN, NAD, Non-toxic, A & O x 3 HEENT: Atraumatic, Normocephalic. Neck supple. No masses, No LAD. Ears and Nose: No external deformity. CV: RRR, No M/G/R. No JVD. No thrill.  No extra heart sounds. PULM: CTA B, no wheezes, crackles, rhonchi. No retractions. No resp. distress. No accessory muscle use. ABD: S, NT, ND, +BS. No rebound. No HSM. EXTR: No c/c/e NEURO Normal gait.  PSYCH: Normally interactive. Conversant. Not depressed or anxious appearing.  Calm demeanor.    Assessment and Plan: Sinusitis augmentin  mucinex d  Signed,  Phillips OdorJeffery Sandeep Radell, MD

## 2013-10-22 ENCOUNTER — Other Ambulatory Visit: Payer: Self-pay

## 2014-03-13 ENCOUNTER — Ambulatory Visit (INDEPENDENT_AMBULATORY_CARE_PROVIDER_SITE_OTHER): Payer: BLUE CROSS/BLUE SHIELD | Admitting: Physician Assistant

## 2014-03-13 VITALS — BP 110/60 | HR 73 | Temp 97.5°F | Resp 16 | Ht 59.5 in | Wt 137.2 lb

## 2014-03-13 DIAGNOSIS — Z6827 Body mass index (BMI) 27.0-27.9, adult: Secondary | ICD-10-CM | POA: Insufficient documentation

## 2014-03-13 DIAGNOSIS — J0100 Acute maxillary sinusitis, unspecified: Secondary | ICD-10-CM | POA: Diagnosis not present

## 2014-03-13 MED ORDER — AMOXICILLIN-POT CLAVULANATE 875-125 MG PO TABS: 1.0000 | ORAL_TABLET | Freq: Two times a day (BID) | ORAL | Status: AC

## 2014-03-13 NOTE — Patient Instructions (Signed)
Get plenty of rest and drink at least 64 ounces of water daily. 

## 2014-03-13 NOTE — Progress Notes (Signed)
Patient ID: Alison Reed, female    DOB: 06/28/1975, 39 y.o.   MRN: [id number]  PCP: Madalynne Gutmann, PA-C  Subjective:   Chief Complaint  Patient presents with  . Cough  . Facial Pain    HPI  10 days of illness. She has chronic allergies, but hasn't been able to relieve her symptoms. Green bloody sputum Cough associated with SOB, doesn't really like to use her albuterol inhaler because it makes her feel jittery and wired. Face and teeth hurt No fever, chills No GI symptoms  Review of Systems Review of Systems  As above.   Patient Active Problem List   Diagnosis Date Noted  . Postpartum anemia 01/06/2012  . Anemia   . Hx of dysmenorrhea 04/16/2007  . History of rectal bleeding 02/06/2004     Prior to Admission medications   Medication Sig Start Date End Date Taking? Authorizing Provider  albuterol (PROVENTIL HFA;VENTOLIN HFA) 108 (90 BASE) MCG/ACT inhaler Inhale into the lungs every 6 (six) hours as needed for wheezing or shortness of breath (once or twice a year).   Yes Historical Provider, MD  cetirizine (ZYRTEC) 10 MG tablet Take 10 mg by mouth daily.   Yes Historical Provider, MD  ketotifen (ZADITOR) 0.025 % ophthalmic solution 1 drop. Once or twice a year   Yes Historical Provider, MD  norethindrone-ethinyl estradiol (JUNEL FE,GILDESS FE,LOESTRIN FE) 1-20 MG-MCG tablet Take 1 tablet by mouth daily.   Yes Historical Provider, MD  pseudoephedrine-guaifenesin (MUCINEX D) 60-600 MG per tablet Take 1 tablet by mouth every 12 (twelve) hours. 06/14/13 06/14/14 Yes Carmelina Dane, MD  sodium chloride (OCEAN) 0.65 % nasal spray Place 1 spray into the nose as needed. allergies   Yes Historical Provider, MD     No Known Allergies     Objective:  Physical Exam  Physical Exam  Constitutional: She is oriented to person, place, and time. She appears well-developed and well-nourished. She is active and cooperative. No distress.  BP 110/60 mmHg  Pulse 73   Temp(Src) 97.5 F (36.4 C) (Oral)  Resp 16  Ht 4' 11.5" (1.511 m)  Wt 137 lb 3.2 oz (62.234 kg)  BMI 27.26 kg/m2  SpO2 96%  LMP 03/07/2014   HENT:  Head: Normocephalic and atraumatic.  Right Ear: Hearing, tympanic membrane, external ear and ear canal normal.  Left Ear: Hearing, tympanic membrane, external ear and ear canal normal.  Nose: Mucosal edema present.  No foreign bodies. Right sinus exhibits no maxillary sinus tenderness and no frontal sinus tenderness. Left sinus exhibits no maxillary sinus tenderness and no frontal sinus tenderness.  Mouth/Throat: Uvula is midline, oropharynx is clear and moist and mucous membranes are normal. No uvula swelling. No oropharyngeal exudate.  Eyes: Conjunctivae and EOM are normal. Pupils are equal, round, and reactive to light. Right eye exhibits no discharge. Left eye exhibits no discharge. No scleral icterus.  Neck: Trachea normal, normal range of motion and full passive range of motion without pain. Neck supple. No thyroid mass and no thyromegaly present.  Cardiovascular: Normal rate, regular rhythm and normal heart sounds.   Pulmonary/Chest: Effort normal and breath sounds normal.  Lymphadenopathy:       Head (right side): No submandibular, no tonsillar, no preauricular, no posterior auricular and no occipital adenopathy present.       Head (left side): No submandibular, no tonsillar, no preauricular and no occipital adenopathy present.    She has no cervical adenopathy.       Right: No  supraclavicular adenopathy present.       Left: No supraclavicular adenopathy present.  Neurological: She is alert and oriented to person, place, and time. She has normal strength. No cranial nerve deficit or sensory deficit.  Skin: Skin is warm, dry and intact. No rash noted.  Psychiatric: She has a normal mood and affect. Her speech is normal and behavior is normal.           Assessment & Plan:   1. Subacute maxillary sinusitis She'll use the inhaler  as needed, let me know if she decides she'd like something for cough, supportive care, RTC if symptoms worsen/persist. - amoxicillin-clavulanate (AUGMENTIN) 875-125 MG per tablet; Take 1 tablet by mouth 2 (two) times daily.  Dispense: 20 tablet; Refill: 0   Fernande Brashelle S. Dequincy Born, PA-C Physician Assistant-Certified [facility]

## 2014-05-03 ENCOUNTER — Ambulatory Visit (INDEPENDENT_AMBULATORY_CARE_PROVIDER_SITE_OTHER): Payer: BLUE CROSS/BLUE SHIELD | Admitting: Physician Assistant

## 2014-05-03 ENCOUNTER — Ambulatory Visit (INDEPENDENT_AMBULATORY_CARE_PROVIDER_SITE_OTHER): Payer: BLUE CROSS/BLUE SHIELD

## 2014-05-03 ENCOUNTER — Encounter: Payer: Self-pay | Admitting: Physician Assistant

## 2014-05-03 VITALS — BP 100/70 | HR 73 | Temp 98.9°F | Resp 16 | Ht 59.5 in | Wt 136.4 lb

## 2014-05-03 DIAGNOSIS — M79645 Pain in left finger(s): Secondary | ICD-10-CM

## 2014-05-03 NOTE — Progress Notes (Signed)
   Subjective:    Patient ID: Alison PickElizabeth N Reed, female    DOB: 02/13/1975, 39 y.o.   MRN: [id number]  HPI  Alison Reed presents today for evaluation of pain in her left index finger.  She has had pain for 2-3 months in her left index finger, and she does not recall any injury to her finger. The pain is worse in morning and improves about 30 minutes after waking up and moving around. Pain worsens with exercise or excessive movement of the left finger. No pain or swelling in her wrist or other fingers. She has taken Tylenol occasionally but no relief. The pain has not affected her range of motion or grip strength, but she does note increased pressure to the DIP and PIP joint is painful. Denies any swelling, erythema, tingling, numbness, or radiating symptoms. Denies additional fatigue.  Review of Systems  Constitutional: Negative for fever, chills, diaphoresis and fatigue.  HENT: Negative for congestion, postnasal drip, rhinorrhea, sinus pressure and sore throat.   Eyes: Negative for pain and itching.  Respiratory: Negative for cough and shortness of breath.   Cardiovascular: Negative for chest pain and palpitations.  Gastrointestinal: Negative for nausea and vomiting.  Musculoskeletal: Positive for arthralgias (Left index finger). Negative for myalgias and joint swelling.  Skin: Negative for rash.  Allergic/Immunologic: Positive for environmental allergies.  Neurological: Negative for dizziness, weakness, light-headedness and headaches.       Objective:   Physical Exam  Constitutional: She is oriented to person, place, and time. She appears well-developed and well-nourished. No distress.  BP 100/70 mmHg  Pulse 73  Temp(Src) 98.9 F (37.2 C) (Oral)  Resp 16  Ht 4' 11.5" (1.511 m)  Wt 136 lb 6.4 oz (61.871 kg)  BMI 27.10 kg/m2  SpO2 100%  LMP 04/15/2014  HENT:  Head: Normocephalic and atraumatic.  Eyes: Conjunctivae are normal. No scleral icterus.  Neck: Neck  supple.  Cardiovascular: Normal rate, regular rhythm and normal heart sounds.  Exam reveals no gallop and no friction rub.   No murmur heard. Pulmonary/Chest: Effort normal and breath sounds normal. She has no wheezes. She has no rales.  Musculoskeletal:       Left hand: She exhibits tenderness (Left index finger). Normal strength noted.       Hands: Left DIP and PIP joint tenderness to palpation. Good range of motion. Equal grip strength 5/5 bilaterally.  Lymphadenopathy:    She has no cervical adenopathy.       Right: No supraclavicular adenopathy present.       Left: No supraclavicular adenopathy present.  Neurological: She is alert and oriented to person, place, and time.  Skin: Skin is warm and dry. No erythema.  Psychiatric: She has a normal mood and affect. Her behavior is normal.      DIP and PIP    Assessment & Plan:

## 2014-05-03 NOTE — Patient Instructions (Signed)
Buddy tape the index finger to the middle finger whenever you can.  Use ibuprofen or naproxen instead of acetaminophen. Be sure to take it with food to reduce the risk of GI side effects.

## 2014-05-03 NOTE — Progress Notes (Signed)
Patient ID: Alison Reed, female    DOB: [birth year], 39 y.o.   MRN: [id number]  PCP: Melchizedek Espinola, PA-C  Subjective:   Chief Complaint  Patient presents with  . finger    left index x 2-3 mos, sore worse with pressure    HPI Presents for evaluation of pain in her left index finger x 2-3 months.  No recalled trauma or injury to her finger. The is right hand dominant. The pain is worse in morning and improves about 30 minutes after waking up and moving around. Pain worsens with exercise or excessive movement of the left finger. No pain or swelling in her wrist or other fingers. She has taken Tylenol occasionally but no relief. The pain has not affected her range of motion or grip strength, but she does note increased pressure to the DIP and PIP joint is painful. Denies any swelling, erythema, tingling, numbness, or radiating symptoms. Denies additional fatigue.  Review of Systems  Constitutional: Negative.   Musculoskeletal: Positive for joint swelling and arthralgias (LEFT index finger). Negative for myalgias.  Skin: Negative.   Allergic/Immunologic: Positive for environmental allergies (seasonal).  Neurological: Negative for weakness and headaches.      Patient Active Problem List   Diagnosis Date Noted  . BMI 27.0-27.9,adult 03/13/2014  . Postpartum anemia 01/06/2012  . Anemia   . Hx of dysmenorrhea 04/16/2007  . History of rectal bleeding 02/06/2004     Prior to Admission medications   Medication Sig Start Date End Date Taking? Authorizing Provider  albuterol (PROVENTIL HFA;VENTOLIN HFA) 108 (90 BASE) MCG/ACT inhaler Inhale into the lungs every 6 (six) hours as needed for wheezing or shortness of breath (once or twice a year).   Yes Historical Provider, MD  cetirizine (ZYRTEC) 10 MG tablet Take 10 mg by mouth daily.   Yes Historical Provider, MD  ketotifen (ZADITOR) 0.025 % ophthalmic solution 1 drop. Once or twice a year   Yes Historical Provider, MD  sodium  chloride (OCEAN) 0.65 % nasal spray Place 1 spray into the nose as needed. allergies   Yes Historical Provider, MD  norethindrone-ethinyl estradiol (JUNEL FE,GILDESS FE,LOESTRIN FE) 1-20 MG-MCG tablet Take 1 tablet by mouth daily.    Historical Provider, MD     No Known Allergies     Objective:  Physical Exam  Constitutional: She is oriented to person, place, and time. She appears well-developed and well-nourished. She is active and cooperative. No distress.  BP 100/70 mmHg  Pulse 73  Temp(Src) 98.9 F (37.2 C) (Oral)  Resp 16  Ht 4' 11.5" (1.511 m)  Wt 136 lb 6.4 oz (61.871 kg)  BMI 27.10 kg/m2  SpO2 100%  LMP 04/15/2014   Eyes: Conjunctivae are normal.  Pulmonary/Chest: Effort normal.  Musculoskeletal:       Left hand: She exhibits tenderness (ulnar aspect of the PIP) and swelling (minimal). She exhibits normal range of motion, no bony tenderness, normal capillary refill, no deformity and no laceration. Normal strength noted.       Hands: Neurological: She is alert and oriented to person, place, and time. She has normal strength. No sensory deficit.  Skin: Skin is warm, dry and intact.  Psychiatric: She has a normal mood and affect. Her speech is normal and behavior is normal.   LEFT Index Finger: [facility] reading (PRIMARY) by  Dr. Cleta Alberts. Normal finger. No deformity noted.          Assessment & Plan:   1. Finger pain, left Likely strain, though  injury not recalled. Buddy tape. OTC NSAIDS. RTC if symptoms worsen/persist. - DG Finger Index Left; Future   Fernande Brashelle S. Jeyren Danowski, PA-C Physician Assistant-Certified [facility] .

## 2014-06-22 LAB — HM PAP SMEAR

## 2014-07-03 ENCOUNTER — Encounter: Payer: Self-pay | Admitting: Physician Assistant

## 2015-01-09 ENCOUNTER — Ambulatory Visit (INDEPENDENT_AMBULATORY_CARE_PROVIDER_SITE_OTHER): Payer: 59 | Admitting: Family Medicine

## 2015-01-09 VITALS — BP 122/72 | HR 81 | Temp 98.0°F | Resp 17 | Ht 60.0 in | Wt 152.0 lb

## 2015-01-09 DIAGNOSIS — J0110 Acute frontal sinusitis, unspecified: Secondary | ICD-10-CM

## 2015-01-09 DIAGNOSIS — J029 Acute pharyngitis, unspecified: Secondary | ICD-10-CM | POA: Diagnosis not present

## 2015-01-09 MED ORDER — AMOXICILLIN-POT CLAVULANATE 875-125 MG PO TABS: 1.0000 | ORAL_TABLET | Freq: Two times a day (BID) | ORAL | Status: DC

## 2015-01-09 MED ORDER — FLUTICASONE PROPIONATE 50 MCG/ACT NA SUSP: 2.0000 | Freq: Every day | NASAL | Status: DC

## 2015-01-09 NOTE — Patient Instructions (Signed)
Continue to drink plenty of fluids and get enough rest  You can continue to take Mucinex as needed to help thin secretions  Take the Augmentin antibiotic (amoxicillin/clavulanate) one twice daily for infection for full 10 days  Use the fluticasone nose spray (Flonase) 2 sprays each nostril twice daily for about 3 days, then reduce to once daily  Return if worse or not improving

## 2015-01-09 NOTE — Progress Notes (Signed)
Patient ID: Alison PickElizabeth N Reed, female    DOB: [birth year]  Age: 40 y.o. MRN: [id number]  Chief Complaint  Patient presents with  . Sinusitis    Subjective:   40 year old lady who does not appear terribly ill. She has been sick since about September intermittently. She's had a respiratory infection, treated elsewhere with steroids but no antibiotics. She gradually improved but it took about 6 weeks for her to be feeling pretty well, then she got sick again. She is having pressure in her head. She is blowing some stuff out of her nose which is purulent. Her throat is not sore. Her chest has not been coughing a lot. She has a history of allergies. Allergy doctor is planning to get a chest x-ray are sometimes soon. She does not smoke. Ears have intermittent discomfort and are "juicy at times ".  Current allergies, medications, problem list, past/family and social histories reviewed.  Objective:  BP 122/72 mmHg  Pulse 81  Temp(Src) 98 F (36.7 C) (Oral)  Resp 17  Ht 5' (1.524 m)  Wt 152 lb (68.947 kg)  BMI 29.69 kg/m2  SpO2 99%  LMP 12/23/2014  Pleasant young lady, alert and oriented. Vitals stable. TMs normal. Mild tenderness over frontal sinuses primarily. Throat is erythematous on the right with some postnasal drainage. Neck supple without significant nodes. Has some scarring both sides of her neck from IV access when a premature infant. Chest clear to auscultation. Heart regular without murmur.  Assessment & Plan:   Assessment: 1. Acute frontal sinusitis, recurrence not specified   2. Acute pharyngitis, unspecified etiology       Plan: Will treat with a round of antibiotics. If she is not improving may need to consider x-rays and/or labs.  No orders of the defined types were placed in this encounter.    Meds ordered this encounter  Medications  . fluticasone (FLONASE) 50 MCG/ACT nasal spray    Sig: Place 2 sprays into both nostrils daily.    Dispense:  16 g    Refill:  1   . amoxicillin-clavulanate (AUGMENTIN) 875-125 MG tablet    Sig: Take 1 tablet by mouth 2 (two) times daily.    Dispense:  20 tablet    Refill:  0         Patient Instructions  Continue to drink plenty of fluids and get enough rest  You can continue to take Mucinex as needed to help thin secretions  Take the Augmentin antibiotic (amoxicillin/clavulanate) one twice daily for infection for full 10 days  Use the fluticasone nose spray (Flonase) 2 sprays each nostril twice daily for about 3 days, then reduce to once daily  Return if worse or not improving     Return if symptoms worsen or fail to improve.   Zyasia Halbleib, MD 01/09/2015

## 2015-02-09 ENCOUNTER — Ambulatory Visit (INDEPENDENT_AMBULATORY_CARE_PROVIDER_SITE_OTHER): Payer: 59

## 2015-02-09 ENCOUNTER — Ambulatory Visit (INDEPENDENT_AMBULATORY_CARE_PROVIDER_SITE_OTHER): Payer: 59 | Admitting: Physician Assistant

## 2015-02-09 VITALS — BP 114/72 | HR 95 | Temp 98.7°F | Resp 18 | Ht 60.0 in | Wt 146.4 lb

## 2015-02-09 DIAGNOSIS — R05 Cough: Secondary | ICD-10-CM

## 2015-02-09 DIAGNOSIS — R0981 Nasal congestion: Secondary | ICD-10-CM | POA: Diagnosis not present

## 2015-02-09 DIAGNOSIS — J0140 Acute pansinusitis, unspecified: Secondary | ICD-10-CM | POA: Diagnosis not present

## 2015-02-09 DIAGNOSIS — R058 Other specified cough: Secondary | ICD-10-CM

## 2015-02-09 MED ORDER — PSEUDOEPHEDRINE HCL 60 MG PO TABS: 60.0000 mg | ORAL_TABLET | Freq: Four times a day (QID) | ORAL | Status: DC | PRN

## 2015-02-09 MED ORDER — LEVOFLOXACIN 750 MG PO TABS: 750.0000 mg | ORAL_TABLET | Freq: Every day | ORAL | Status: AC

## 2015-02-09 NOTE — Patient Instructions (Addendum)
Because you received an x-ray today, you will receive an invoice from [facility]. Please contact [facility] at [phone] with questions or concerns regarding your invoice. Our billing staff will not be able to assist you with those questions.  Please hydrate well with 64 oz per day. I would like you to be adamant of taking the levocetirizine and the singulair.    Sinusitis, Adult Sinusitis is redness, soreness, and inflammation of the paranasal sinuses. Paranasal sinuses are air pockets within the bones of your face. They are located beneath your eyes, in the middle of your forehead, and above your eyes. In healthy paranasal sinuses, mucus is able to drain out, and air is able to circulate through them by way of your nose. However, when your paranasal sinuses are inflamed, mucus and air can become trapped. This can allow bacteria and other germs to grow and cause infection. Sinusitis can develop quickly and last only a short time (acute) or continue over a long period (chronic). Sinusitis that lasts for more than 12 weeks is considered chronic. CAUSES Causes of sinusitis include:  Allergies.  Structural abnormalities, such as displacement of the cartilage that separates your nostrils (deviated septum), which can decrease the air flow through your nose and sinuses and affect sinus drainage.  Functional abnormalities, such as when the small hairs (cilia) that line your sinuses and help remove mucus do not work properly or are not present. SIGNS AND SYMPTOMS Symptoms of acute and chronic sinusitis are the same. The primary symptoms are pain and pressure around the affected sinuses. Other symptoms include:  Upper toothache.  Earache.  Headache.  Bad breath.  Decreased sense of smell and taste.  A cough, which worsens when you are lying flat.  Fatigue.  Fever.  Thick drainage from your nose, which often is green and may contain pus (purulent).  Swelling and  warmth over the affected sinuses. DIAGNOSIS Your health care provider will perform a physical exam. During your exam, your health care provider may perform any of the following to help determine if you have acute sinusitis or chronic sinusitis:  Look in your nose for signs of abnormal growths in your nostrils (nasal polyps).  Tap over the affected sinus to check for signs of infection.  View the inside of your sinuses using an imaging device that has a light attached (endoscope). If your health care provider suspects that you have chronic sinusitis, one or more of the following tests may be recommended:  Allergy tests.  Nasal culture. A sample of mucus is taken from your nose, sent to a lab, and screened for bacteria.  Nasal cytology. A sample of mucus is taken from your nose and examined by your health care provider to determine if your sinusitis is related to an allergy. TREATMENT Most cases of acute sinusitis are related to a viral infection and will resolve on their own within 10 days. Sometimes, medicines are prescribed to help relieve symptoms of both acute and chronic sinusitis. These may include pain medicines, decongestants, nasal steroid sprays, or saline sprays. However, for sinusitis related to a bacterial infection, your health care provider will prescribe antibiotic medicines. These are medicines that will help kill the bacteria causing the infection. Rarely, sinusitis is caused by a fungal infection. In these cases, your health care provider will prescribe antifungal medicine. For some cases of chronic sinusitis, surgery is needed. Generally, these are cases in which sinusitis recurs more than 3 times per year, despite other treatments. HOME CARE INSTRUCTIONS  Drink plenty of water. Water helps thin the mucus so your sinuses can drain more easily.  Use a humidifier.  Inhale steam 3-4 times a day (for example, sit in the bathroom with the shower running).  Apply a warm,  moist washcloth to your face 3-4 times a day, or as directed by your health care provider.  Use saline nasal sprays to help moisten and clean your sinuses.  Take medicines only as directed by your health care provider.  If you were prescribed either an antibiotic or antifungal medicine, finish it all even if you start to feel better. SEEK IMMEDIATE MEDICAL CARE IF:  You have increasing pain or severe headaches.  You have nausea, vomiting, or drowsiness.  You have swelling around your face.  You have vision problems.  You have a stiff neck.  You have difficulty breathing.   This information is not intended to replace advice given to you by your health care provider. Make sure you discuss any questions you have with your health care provider.   Document Released: 12/24/2004 Document Revised: 01/14/2014 Document Reviewed: 01/08/2011 Elsevier Interactive Patient Education 2016 [employer]  Levofloxacin tablets What is this medicine? LEVOFLOXACIN (lee voe FLOX a sin) is a quinolone antibiotic. It is used to treat certain kinds of bacterial infections. It will not work for colds, flu, or other viral infections. This medicine may be used for other purposes; ask your health care provider or pharmacist if you have questions. What should I tell my health care provider before I take this medicine? They need to know if you have any of these conditions: -bone problems -cerebral disease -history of low levels of potassium in the blood -irregular heartbeat -joint problems -kidney disease -myasthenia gravis -seizures -tendon problems -tingling of the fingers or toes, or other nerve disorder -an unusual or allergic reaction to levofloxacin, other quinolone antibiotics, foods, dyes, or preservatives -pregnant or trying to get pregnant -breast-feeding How should I use this medicine? Take this medicine by mouth with a full glass of water. Follow the directions on the prescription label.  This medicine can be taken with or without food. Take your medicine at regular intervals. Do not take your medicine more often than directed. Do not skip doses or stop your medicine early even if you feel better. Do not stop taking except on your doctor's advice. A special MedGuide will be given to you by the pharmacist with each prescription and refill. Be sure to read this information carefully each time. Talk to your pediatrician regarding the use of this medicine in children. While this drug may be prescribed for children as young as 6 months for selected conditions, precautions do apply. Overdosage: If you think you have taken too much of this medicine contact a poison control center or emergency room at once. NOTE: This medicine is only for you. Do not share this medicine with others. What if I miss a dose? If you miss a dose, take it as soon as you remember. If it is almost time for your next dose, take only that dose. Do not take double or extra doses. What may interact with this medicine? Do not take this medicine with any of the following medications: -arsenic trioxide -chloroquine -droperidol -medicines for irregular heart rhythm like amiodarone, disopyramide, dofetilide, flecainide, quinidine, procainamide, sotalol -some medicines for depression or mental problems like phenothiazines, pimozide, and ziprasidone This medicine may also interact with the following medications: -amoxapine -antacids -birth control pills -cisapride -dairy products -didanosine (ddI) buffered tablets  or powder -haloperidol -multivitamins -NSAIDS, medicines for pain and inflammation, like ibuprofen or naproxen -retinoid products like tretinoin or isotretinoin -risperidone -some other antibiotics like clarithromycin or erythromycin -sucralfate -theophylline -warfarin This list may not describe all possible interactions. Give your health care provider a list of all the medicines, herbs, non-prescription  drugs, or dietary supplements you use. Also tell them if you smoke, drink alcohol, or use illegal drugs. Some items may interact with your medicine. What should I watch for while using this medicine? Tell your doctor or health care professional if your symptoms do not improve or if they get worse. Drink several glasses of water a day and cut down on drinks that contain caffeine. You must not get dehydrated while taking this medicine. You may get drowsy or dizzy. Do not drive, use machinery, or do anything that needs mental alertness until you know how this medicine affects you. Do not sit or stand up quickly, especially if you are an older patient. This reduces the risk of dizzy or fainting spells. This medicine can make you more sensitive to the sun. Keep out of the sun. If you cannot avoid being in the sun, wear protective clothing and use a sunscreen. Do not use sun lamps or tanning beds/booths. Contact your doctor if you get a sunburn. If you are a diabetic monitor your blood glucose carefully. If you get an unusual reading stop taking this medicine and call your doctor right away. Do not treat diarrhea with over-the-counter products. Contact your doctor if you have diarrhea that lasts more than 2 days or if the diarrhea is severe and watery. Avoid antacids, calcium, iron, and zinc products for 2 hours before and 2 hours after taking a dose of this medicine. What side effects may I notice from receiving this medicine? Side effects that you should report to your doctor or health care professional as soon as possible: -allergic reactions like skin rash or hives, swelling of the face, lips, or tongue -anxious -confusion -depressed mood -diarrhea -fast, irregular heartbeat -hallucination, loss of contact with reality -joint, muscle, or tendon pain or swelling -pain, tingling, numbness in the hands or feet -suicidal thoughts or other mood changes -sunburn -unusually weak or tired Side effects  that usually do not require medical attention (report to your doctor or health care professional if they continue or are bothersome): -dry mouth -headache -nausea -trouble sleeping This list may not describe all possible side effects. Call your doctor for medical advice about side effects. You may report side effects to FDA at 1-800-FDA-1088. Where should I keep my medicine? Keep out of the reach of children. Store at room temperature between 15 and 30 degrees C (59 and 86 degrees F). Keep in a tightly closed container. Throw away any unused medicine after the expiration date. NOTE: This sheet is a summary. It may not cover all possible information. If you have questions about this medicine, talk to your doctor, pharmacist, or health care provider.    2016, Elsevier/Gold Standard. (2014-08-04 12:40:18)

## 2015-02-09 NOTE — Progress Notes (Signed)
[facility] [address], [place] [address] [phone]  Date:  02/09/2015   Name:  Alison Reed   DOB:  April 20, 1975   MRN:  [id number]  PCP:  JEFFERY,CHELLE, PA-C   Chief Complaint  Patient presents with  . Sinusitis    x1 month    History of Present Illness:  Alison Reed is a 40 y.o. female patient who presents to [facility] for sinus pain. Patient has    Sinus pressure with mild cough.  She is coughing a productive sputum.  Color unknown.  Nasal mucus is yellow and extremely thick.  No nose bleeds.  Voice is hoarse.  Sore throat.  Ears are full feeling by the afternoon, with some pain.  She has no fever.  She is not feeling fatigued.  No pets. Her allergies respond to dust.    She has some dyspnea, but will take her albuterol.  She has 2 young children, with high bacteria/viral exposure.  Zyrtec, flonase, and yellow pill.   Patient Active Problem List   Diagnosis Date Noted  . BMI 27.0-27.9,adult 03/13/2014  . Anemia   . Hx of dysmenorrhea 04/16/2007  . History of rectal bleeding 02/06/2004    Past Medical History  Diagnosis Date  . History of chicken pox   . History of rectal bleeding 02/06/04  . Headache(784.0)     SINUS;ALLERGY RELATED  . Infection     YEAST;NOT FREQ  . PMS (premenstrual syndrome)   . Premature baby     @ 30 WKS;HAD SEVERAL SURGERIES;HAD COLOSTOMY  . Diabetes mellitus without complication (HCC)   . Gestational diabetes     diet controlled  . Failed induction, delivered, current hospitalization 01/04/2012    Induction for GDM-diet controlled. Failed VBAC.  . Status post repeat low transverse cesarean section 01/04/2012    Emergency c/s for NRFHT; failed induction. Failed VBAC.  . Status post tubal ligation at time of delivery, current hospitalization 01/04/2012  . Status post repeat low transverse cesarean section 01/04/2012    Emergency c/s for NRFHT; failed induction. Failed VBAC.   [name] GDM (gestational diabetes  mellitus) 10/21/2011    [place] q 2-4 weeks.   Pt needs NST 2x/wk or BPP weekly only if she needs meds to control BS   . History of cesarean delivery - 36wks, in labor, breech - w [facility] 06/10/2011    2 layer closure per [facility]   . Postpartum anemia 01/06/2012    Past Surgical History  Procedure Laterality Date  . Wisdom tooth extraction    . Colostomy    . Cesarean section  10/30/2009  . Abdominal surgery      infant abdomen reconstruction  . Abdominal surgery      revise scar  . Cesarean section with bilateral tubal ligation  01/04/2012    Procedure: CESAREAN SECTION WITH BILATERAL TUBAL LIGATION;  Surgeon: Michael Litter, MD;  Location: [facility];  Service: Obstetrics;;  Repeat Cesarean Section Delivery Baby Girl @ 0503, apgars 8/9, Bilateral Tubal Ligation    Social History  Substance Use Topics  . Smoking status: Never Smoker   . Smokeless tobacco: Never Used  . Alcohol Use: No     Comment: SOCIALLY OR ON HOLIDAYS ONLY ;2-3 GLASSES OF WINE/YEAR    Family History  Problem Relation Age of Onset  . Adopted: Yes  . Heart disease Maternal Grandmother   . Cancer Father   . Thyroid disease Maternal Grandmother  No Known Allergies  Medication list has been reviewed and updated.  Current Outpatient Prescriptions on File Prior to Visit  Medication Sig Dispense Refill  . albuterol (PROVENTIL HFA;VENTOLIN HFA) 108 (90 BASE) MCG/ACT inhaler Inhale into the lungs every 6 (six) hours as needed for wheezing or shortness of breath (once or twice a year).    . cetirizine (ZYRTEC) 10 MG tablet Take 10 mg by mouth daily.    . fluticasone (FLONASE) 50 MCG/ACT nasal spray Place 2 sprays into both nostrils daily. 16 g 1  . sodium chloride (OCEAN) 0.65 % nasal spray Place 1 spray into the nose as needed. allergies    . amoxicillin-clavulanate (AUGMENTIN) 875-125 MG tablet Take 1 tablet by mouth 2 (two) times daily. (Patient not taking: Reported on 02/09/2015) 20 tablet 0  . ketotifen (ZADITOR) 0.025 %  ophthalmic solution 1 drop. Reported on 02/09/2015     No current facility-administered medications on file prior to visit.    ROS ROS otherwise unremarkable unless listed above.   Physical Examination: BP 114/72 mmHg  Pulse 95  Temp(Src) 98.7 F (37.1 C) (Oral)  Resp 18  Ht 5' (1.524 m)  Wt 146 lb 6.4 oz (66.407 kg)  BMI 28.59 kg/m2  SpO2 97%  LMP 01/12/2015 Ideal Body Weight: Weight in (lb) to have BMI = 25: 127.7  Physical Exam  Constitutional: She is oriented to person, place, and time. She appears well-developed and well-nourished. No distress.  HENT:  Head: Normocephalic and atraumatic.  Right Ear: External ear and ear canal normal. Tympanic membrane is retracted. Tympanic membrane is not perforated and not erythematous. No middle ear effusion.  Left Ear: External ear and ear canal normal. Tympanic membrane is retracted. Tympanic membrane is not perforated and not erythematous.  No middle ear effusion.  Nose: Mucosal edema and rhinorrhea present. Right sinus exhibits no maxillary sinus tenderness and no frontal sinus tenderness. Left sinus exhibits no maxillary sinus tenderness and no frontal sinus tenderness.  Mouth/Throat: No uvula swelling. No oropharyngeal exudate, posterior oropharyngeal edema or posterior oropharyngeal erythema.  Nasal turbinates very swollen.    Eyes: Conjunctivae and EOM are normal. Pupils are equal, round, and reactive to light.  Cardiovascular: Normal rate and regular rhythm.  Exam reveals no gallop, no distant heart sounds and no friction rub.   No murmur heard. Pulmonary/Chest: Effort normal. No respiratory distress. She has no decreased breath sounds. She has no wheezes. She has no rhonchi.  Lymphadenopathy:       Head (right side): No submandibular, no tonsillar, no preauricular and no posterior auricular adenopathy present.       Head (left side): No submandibular, no tonsillar, no preauricular and no posterior auricular adenopathy present.   Neurological: She is alert and oriented to person, place, and time.  Skin: She is not diaphoretic.  Psychiatric: She has a normal mood and affect. Her behavior is normal.     Assessment and Plan: Alison Reed is a 40 y.o. female who is here today for cc of continued nasal congestion. -I will treat her for complicated sinusitis.  levaquin given today--discussed side effects verbally and in handout.   -contacted pharmacy, who stated that she is taking the Singulair, but it should be out by now--she has refills. -advised that we start sudafed, and to take singulair daily (she misses doses), as well as her levocetirizine.  If this does not improve within the next 10 days, we will refer to her ent.    Subacute pansinusitis -  Plan: levofloxacin (LEVAQUIN) 750 MG tablet, pseudoephedrine (SUDAFED) 60 MG tablet  Productive cough - Plan: DG Chest 2 View, pseudoephedrine (SUDAFED) 60 MG tablet  Nasal congestion - Plan: pseudoephedrine (SUDAFED) 60 MG tablet  Trena Platt, PA-C [facility] 2/2/201710:02 AM

## 2015-03-21 ENCOUNTER — Ambulatory Visit (INDEPENDENT_AMBULATORY_CARE_PROVIDER_SITE_OTHER): Payer: 59

## 2015-03-21 ENCOUNTER — Ambulatory Visit (INDEPENDENT_AMBULATORY_CARE_PROVIDER_SITE_OTHER): Payer: 59 | Admitting: Emergency Medicine

## 2015-03-21 VITALS — BP 112/78 | HR 74 | Temp 98.6°F | Resp 16 | Ht 59.5 in | Wt 145.0 lb

## 2015-03-21 DIAGNOSIS — R062 Wheezing: Secondary | ICD-10-CM

## 2015-03-21 DIAGNOSIS — R0981 Nasal congestion: Secondary | ICD-10-CM

## 2015-03-21 DIAGNOSIS — J0140 Acute pansinusitis, unspecified: Secondary | ICD-10-CM | POA: Diagnosis not present

## 2015-03-21 MED ORDER — AMOXICILLIN-POT CLAVULANATE 875-125 MG PO TABS
1.0000 | ORAL_TABLET | Freq: Two times a day (BID) | ORAL | Status: DC
Start: 2015-03-21 — End: 2016-01-02

## 2015-03-21 MED ORDER — PREDNISONE 10 MG PO TABS: ORAL_TABLET | ORAL | Status: DC

## 2015-03-21 NOTE — Patient Instructions (Signed)

## 2015-03-21 NOTE — Progress Notes (Signed)
Patient ID: Alison PickElizabeth N Reed, female   DOB: 24-Dec-1975, 40 y.o.   MRN: [id number]    By signing my name below, I, Essence Howell, attest that this documentation has been prepared under the direction and in the presence of Collene GobbleSteven A Kyndal Gloster, MD Electronically Signed: Charline BillsEssence Howell, ED Scribe 03/21/2015 at 11:47 AM.  Chief Complaint:  Chief Complaint  Patient presents with  . Cough    feel her chest heavy - some wheezing x 3 days  . Nasal Congestion    bleeding every morning x 2 weeks   HPI: Alison Reed is a 40 y.o. female, with a h/o asthma and seasonal allergies, [facility] complaining of persistent cough onset 3 days ago. Pt reports associated wheezing, nasal congestion, tenderness to the septum and minimal bleeding in nares. She states that she has been seen approximately 2 other times for similar symptoms over the past 2 months. Pt states that she is on an allergy regimen which includes taking Zyrtec and Singulair at night, albuterol in the morning and another inhaler that she takes PRN. She is followed by allergies Dr. Vergia Albertsobert Van Winkle, MD every 6 months. Pt's daughter has been ill with flu-like symptoms.   Past Medical History  Diagnosis Date  . History of chicken pox   . History of rectal bleeding 02/06/04  . Headache(784.0)     SINUS;ALLERGY RELATED  . Infection     YEAST;NOT FREQ  . PMS (premenstrual syndrome)   . Premature baby     @ 30 WKS;HAD SEVERAL SURGERIES;HAD COLOSTOMY  . Diabetes mellitus without complication (HCC)   . Gestational diabetes     diet controlled  . Failed induction, delivered, current hospitalization 01/04/2012    Induction for GDM-diet controlled. Failed VBAC.  . Status post repeat low transverse cesarean section 01/04/2012    Emergency c/s for NRFHT; failed induction. Failed VBAC.  . Status post tubal ligation at time of delivery, current hospitalization 01/04/2012  . Status post repeat low transverse cesarean section  01/04/2012    Emergency c/s for NRFHT; failed induction. Failed VBAC.   [name]. GDM (gestational diabetes mellitus) 10/21/2011    [place] q 2-4 weeks.   Pt needs NST 2x/wk or BPP weekly only if she needs meds to control BS   . History of cesarean delivery - 36wks, in labor, breech - w [facility] 06/10/2011    2 layer closure per [facility]   . Postpartum anemia 01/06/2012   Past Surgical History  Procedure Laterality Date  . Wisdom tooth extraction    . Colostomy    . Cesarean section  10/30/2009  . Abdominal surgery      infant abdomen reconstruction  . Abdominal surgery      revise scar  . Cesarean section with bilateral tubal ligation  01/04/2012    Procedure: CESAREAN SECTION WITH BILATERAL TUBAL LIGATION;  Surgeon: [name], MD;  Location: [facility];  Service: Obstetrics;;  Repeat Cesarean Section Delivery Baby Girl @ 0503, apgars 8/9, Bilateral Tubal Ligation   Social History   Social History  . Marital Status: Married    Spouse Name: JEFF Gargus  . Number of Children: 1  . Years of Education: 13   Occupational History  . [employer]    Social History Main Topics  . Smoking status: Never Smoker   . Smokeless tobacco: Never Used  . Alcohol Use: No     Comment: SOCIALLY OR ON HOLIDAYS ONLY ;2-3 GLASSES OF WINE/YEAR  . Drug  Use: No  . Sexual Activity:    Partners: Male    Birth Control/ Protection: None     Comment: currently pregnant   Other Topics Concern  . None   Social History Narrative   Lives with her husband and their two children.   Family History  Problem Relation Age of Onset  . Adopted: Yes  . Heart disease Maternal Grandmother   . Cancer Father   . Thyroid disease Maternal Grandmother    No Known Allergies Prior to Admission medications   Medication Sig Start Date End Date Taking? Authorizing Provider  albuterol (PROVENTIL HFA;VENTOLIN HFA) 108 (90 BASE) MCG/ACT inhaler Inhale into the lungs every 6 (six) hours as needed for wheezing or shortness of breath (once or twice  a year).   Yes Historical Provider, MD  cetirizine (ZYRTEC) 10 MG tablet Take 10 mg by mouth daily.   Yes Historical Provider, MD  fluticasone (FLONASE) 50 MCG/ACT nasal spray Place 2 sprays into both nostrils daily. 01/09/15  Yes Peyton Najjar, MD  ketotifen (ZADITOR) 0.025 % ophthalmic solution 1 drop. Reported on 02/09/2015   Yes Historical Provider, MD  montelukast (SINGULAIR) 10 MG tablet Take 10 mg by mouth at bedtime.   Yes Historical Provider, MD  sodium chloride (OCEAN) 0.65 % nasal spray Place 1 spray into the nose as needed. allergies   Yes Historical Provider, MD  pseudoephedrine (SUDAFED) 60 MG tablet Take 1 tablet (60 mg total) by mouth every 6 (six) hours as needed for congestion. Patient not taking: Reported on 03/21/2015 02/09/15   Collie Siad English, PA   ROS: The patient denies fevers, chills, night sweats, unintentional weight loss, chest pain, palpitations, dyspnea on exertion, nausea, vomiting, abdominal pain, dysuria, hematuria, melena, numbness, weakness, or tingling. +congestion, +cough, +wheezing  All other systems have been reviewed and were otherwise negative with the exception of those mentioned in the HPI and as above.    PHYSICAL EXAM: Filed Vitals:   03/21/15 1042  BP: 112/78  Pulse: 74  Temp: 98.6 F (37 C)  Resp: 16   Body mass index is 28.81 kg/(m^2).  General: Alert, no acute distress HEENT:  Normocephalic, atraumatic, oropharynx patent. Significant nasal congestion with some purulence on the R side.  Eye: Nonie Hoyer [facility] Cardiovascular: Regular rate and rhythm, no rubs murmurs or gallops. No Carotid bruits, radial pulse intact.No pedal edema.  Respiratory: Clear to auscultation bilaterally. No wheezes, rales, or rhonchi. No cyanosis, no use of accessory musculature Abdominal: No organomegaly, abdomen is soft and non-tender, positive bowel sounds. No masses. Musculoskeletal: Gait intact. No edema, tenderness Skin: No rashes. Neurologic: Facial musculature  symmetric. Psychiatric: Patient acts appropriately throughout our interaction. Lymphatic: No cervical or submandibular lymphadenopathy  LABS:  EKG/XRAY:   Primary read interpreted by Dr. Cleta Alberts at [facility]. Dg Sinus 1-2 Views  03/21/2015  CLINICAL DATA:  Sinus congestion EXAM: PARANASAL SINUSES - 1-2 VIEW COMPARISON:  None. FINDINGS: Water's view sinus obtained. Frontal sinuses are somewhat hypoplastic. Aerated paranasal sinuses appear clear. No air-fluid level. No bony destruction or expansion. Nasal septum is midline. IMPRESSION: Aerated paranasal sinuses appear clear on this single view. Frontal sinuses are noted to be somewhat hypoplastic. Electronically Signed   By: Bretta Bang III M.D.   On: 03/21/2015 11:41   ASSESSMENT/PLAN: I think patient has accommodation of allergic rhinitis with non-resolving sinusitis. Will treat with Augmentin and prednisone. Referral made you know some throat get their help. She is currently under the care of an allergist.I personally performed  the services described in this documentation, which was scribed in my presence. The recorded information has been reviewed and is accurate.    Gross sideeffects, risk and benefits, and alternatives of medications d/w patient. Patient is aware that all medications have potential sideeffects and we are unable to predict every sideeffect or drug-drug interaction that may occur.  Lesle Chris MD 03/21/2015 11:22 AM

## 2015-04-03 ENCOUNTER — Other Ambulatory Visit: Payer: Self-pay | Admitting: Allergy and Immunology

## 2015-04-03 DIAGNOSIS — J329 Chronic sinusitis, unspecified: Secondary | ICD-10-CM

## 2015-04-06 ENCOUNTER — Ambulatory Visit
Admission: RE | Admit: 2015-04-06 | Discharge: 2015-04-06 | Disposition: A | Discharge status: Home / Self Care | Payer: 59 | Source: Ambulatory Visit | Attending: Allergy and Immunology | Admitting: Allergy and Immunology

## 2015-04-06 DIAGNOSIS — J329 Chronic sinusitis, unspecified: Secondary | ICD-10-CM

## 2015-04-14 DIAGNOSIS — J302 Other seasonal allergic rhinitis: Secondary | ICD-10-CM | POA: Insufficient documentation

## 2015-04-14 DIAGNOSIS — J342 Deviated nasal septum: Secondary | ICD-10-CM | POA: Insufficient documentation

## 2015-07-12 LAB — HM PAP SMEAR

## 2016-01-02 ENCOUNTER — Ambulatory Visit (INDEPENDENT_AMBULATORY_CARE_PROVIDER_SITE_OTHER): Payer: 59 | Admitting: Emergency Medicine

## 2016-01-02 VITALS — BP 100/62 | HR 97 | Temp 97.0°F | Resp 18 | Ht 59.5 in | Wt 140.0 lb

## 2016-01-02 DIAGNOSIS — R05 Cough: Secondary | ICD-10-CM | POA: Diagnosis not present

## 2016-01-02 DIAGNOSIS — R059 Cough, unspecified: Secondary | ICD-10-CM

## 2016-01-02 DIAGNOSIS — J069 Acute upper respiratory infection, unspecified: Secondary | ICD-10-CM | POA: Diagnosis not present

## 2016-01-02 MED ORDER — AZITHROMYCIN 250 MG PO TABS: ORAL_TABLET | ORAL | 0 refills | Status: DC

## 2016-01-02 NOTE — Progress Notes (Signed)
Alison Reed 40 y.o.   ChRoselie Reed Complaint  Patient presents with  . Cough  . Nasal Congestion    HISTORY OF PRESENT ILLNESS: This is a 40 y.o. female complaining of cough and congestion on and off for several months worse the last 2 weeks and now cough turning productive. Cough  This is a recurrent problem. The current episode started 1 to 4 weeks ago. The problem has been gradually worsening. The problem occurs constantly. The cough is productive of sputum. Associated symptoms include nasal congestion. Pertinent negatives include no chest pain, chills, ear congestion, ear pain, fever, headaches, hemoptysis, myalgias, postnasal drip, rash, rhinorrhea, sore throat, shortness of breath or wheezing. The symptoms are aggravated by cold air. Risk factors: none. Treatments tried: sudafed. The treatment provided mild relief.     Prior to Admission medications   Medication Sig Start Date End Date Taking? Authorizing Provider  albuterol (PROVENTIL HFA;VENTOLIN HFA) 108 (90 BASE) MCG/ACT inhaler Inhale into the lungs every 6 (six) hours as needed for wheezing or shortness of breath (once or twice a year).   Yes Historical Provider, MD  cetirizine (ZYRTEC) 10 MG tablet Take 10 mg by mouth daily.   Yes Historical Provider, MD  fluticasone (FLONASE) 50 MCG/ACT nasal spray Place 2 sprays into both nostrils daily. 01/09/15  Yes [name], MD  montelukast (SINGULAIR) 10 MG tablet Take 10 mg by mouth at bedtime.   Yes Historical Provider, MD  pseudoephedrine (SUDAFED) 60 MG tablet Take 1 tablet (60 mg total) by mouth every 6 (six) hours as needed for congestion. 02/09/15  Yes [name], PA  sodium chloride (OCEAN) 0.65 % nasal spray Place 1 spray into the nose as needed. allergies   Yes Historical Provider, MD  ketotifen (ZADITOR) 0.025 % ophthalmic solution 1 drop. Reported on 02/09/2015    Historical Provider, MD    No Known Allergies  Patient Active Problem List   Diagnosis Date Noted   . BMI 27.0-27.9,adult 03/13/2014  . Anemia   . Hx of dysmenorrhea 04/16/2007  . History of rectal bleeding 02/06/2004    Past Medical History:  Diagnosis Date  . Diabetes mellitus without complication (HCC)   . Failed induction, delivered, current hospitalization 01/04/2012   Induction for GDM-diet controlled. Failed VBAC.  Alison Reed. GDM (gestational diabetes mellitus) 10/21/2011   [place] q 2-4 weeks.   Pt needs NST 2x/wk or BPP weekly only if she needs meds to control BS   . Gestational diabetes    diet controlled  . Headache(784.0)    SINUS;ALLERGY RELATED  . History of cesarean delivery - 36wks, in labor, breech - w [facility] 06/10/2011   2 layer closure per [facility]   . History of chicken pox   . History of rectal bleeding 02/06/04  . Infection    YEAST;NOT FREQ  . PMS (premenstrual syndrome)   . Postpartum anemia 01/06/2012  . Premature baby    @ 30 WKS;HAD SEVERAL SURGERIES;HAD COLOSTOMY  . Status post repeat low transverse cesarean section 01/04/2012   Emergency c/s for NRFHT; failed induction. Failed VBAC.  . Status post repeat low transverse cesarean section 01/04/2012   Emergency c/s for NRFHT; failed induction. Failed VBAC.   . Status post tubal ligation at time of delivery, current hospitalization 01/04/2012    Past Surgical History:  Procedure Laterality Date  . ABDOMINAL SURGERY     infant abdomen reconstruction  . ABDOMINAL SURGERY     revise scar  . CESAREAN SECTION  10/30/2009  .  CESAREAN SECTION WITH BILATERAL TUBAL LIGATION  01/04/2012   Procedure: CESAREAN SECTION WITH BILATERAL TUBAL LIGATION;  Surgeon: [name], MD;  Location: [facility];  Service: Obstetrics;;  Repeat Cesarean Section Delivery Baby Girl @ 0503, apgars 8/9, Bilateral Tubal Ligation  . COLOSTOMY    . WISDOM TOOTH EXTRACTION      Social History   Social History  . Marital status: Married    Spouse name: Alison Reed  . Number of children: 1  . Years of education: 7013   Occupational History  .  [employer]    Social History Main Topics  . Smoking status: Never Smoker  . Smokeless tobacco: Never Used  . Alcohol use No     Comment: SOCIALLY OR ON HOLIDAYS ONLY ;2-3 GLASSES OF WINE/YEAR  . Drug use: No  . Sexual activity: Yes    Partners: Male    Birth control/ protection: None     Comment: currently pregnant   Other Topics Concern  . Not on file   Social History Narrative   Lives with her husband and their two children.    Family History  Problem Relation Age of Onset  . Adopted: Yes  . Cancer Father   . Heart disease Maternal Grandmother   . Thyroid disease Maternal Grandmother      Review of Systems  Constitutional: Negative for chills, diaphoresis, fever and malaise/fatigue.  HENT: Positive for congestion and sinus pain. Negative for ear pain, hearing loss, postnasal drip, rhinorrhea and sore throat.   Eyes: Negative.   Respiratory: Positive for cough and sputum production. Negative for hemoptysis, shortness of breath and wheezing.   Cardiovascular: Negative for chest pain, palpitations and leg swelling.  Gastrointestinal: Negative for abdominal pain, diarrhea, nausea and vomiting.  Genitourinary: Negative.   Musculoskeletal: Negative for joint pain, myalgias and neck pain.  Skin: Negative.  Negative for rash.  Neurological: Negative.  Negative for headaches.  Endo/Heme/Allergies: Negative.   Psychiatric/Behavioral: Negative.      Physical Exam  Constitutional: She is oriented to person, place, and time. She appears well-developed and well-nourished.  HENT:  Head: Normocephalic and atraumatic.  Right Ear: External ear normal.  Left Ear: External ear normal.  Nose: Nose normal.  Mouth/Throat: Oropharynx is clear and moist.  Eyes: Conjunctivae and EOM are normal. Pupils are equal, round, and reactive to light.  Neck: Normal range of motion. Neck supple.  Cardiovascular: Normal rate, regular rhythm, normal heart sounds and intact distal pulses.    Pulmonary/Chest: Effort normal and breath sounds normal.  Abdominal: Soft. Bowel sounds are normal.  Musculoskeletal: Normal range of motion.  Neurological: She is alert and oriented to person, place, and time.  Skin: Skin is warm and dry.  Psychiatric: She has a normal mood and affect.  Vitals reviewed.  Vitals:   01/02/16 0938  BP: 100/62  Pulse: 97  Resp: 18  Temp: 97 F (36.1 C)     ASSESSMENT & PLAN:  [name] was seen today for cough and nasal congestion.  Diagnoses and all orders for this visit:  Acute upper respiratory infection  Cough   URI instructions given. Will start Z-pak for secondary bacterial infection. Return if worse or no better in 1-2 weeks.  Edwina BarthMiguel Brantley Wiley, MD [facility]

## 2016-01-02 NOTE — Patient Instructions (Addendum)
     IF you received an x-ray today, you will receive an invoice from [facility]. Please contact [facility] at [phone] with questions or concerns regarding your invoice.   IF you received labwork today, you will receive an invoice from [facility]. Please contact [facility] at [phone] with questions or concerns regarding your invoice.   Our billing staff will not be able to assist you with questions regarding bills from these companies.  You will be contacted with the lab results as soon as they are available. The fastest way to get your results is to activate your My Chart account. Instructions are located on the last page of this paperwork. If you have not heard from us regarding the results in 2 weeks, please contact this office.      Upper Respiratory Infection, Adult Most upper respiratory infections (URIs) are caused by a virus. A URI affects the nose, throat, and upper air passages. The most common type of URI is often called "the common cold." Follow these instructions at home:  Take medicines only as told by your doctor.  Gargle warm saltwater or take cough drops to comfort your throat as told by your doctor.  Use a warm mist humidifier or inhale steam from a shower to increase air moisture. This may make it easier to breathe.  Drink enough fluid to keep your pee (urine) clear or pale yellow.  Eat soups and other clear broths.  Have a healthy diet.  Rest as needed.  Go back to work when your fever is gone or your doctor says it is okay.  You may need to stay home longer to avoid giving your URI to others.  You can also wear a face mask and wash your hands often to prevent spread of the virus.  Use your inhaler more if you have asthma.  Do not use any tobacco products, including cigarettes, chewing tobacco, or electronic cigarettes. If you need help quitting, ask your doctor. Contact a doctor if:  You are getting worse, not better.  Your  symptoms are not helped by medicine.  You have chills.  You are getting more short of breath.  You have brown or red mucus.  You have yellow or brown discharge from your nose.  You have pain in your face, especially when you bend forward.  You have a fever.  You have puffy (swollen) neck glands.  You have pain while swallowing.  You have white areas in the back of your throat. Get help right away if:  You have very bad or constant:  Headache.  Ear pain.  Pain in your forehead, behind your eyes, and over your cheekbones (sinus pain).  Chest pain.  You have long-lasting (chronic) lung disease and any of the following:  Wheezing.  Long-lasting cough.  Coughing up blood.  A change in your usual mucus.  You have a stiff neck.  You have changes in your:  Vision.  Hearing.  Thinking.  Mood. This information is not intended to replace advice given to you by your health care provider. Make sure you discuss any questions you have with your health care provider. Document Released: 06/12/2007 Document Revised: 08/27/2015 Document Reviewed: 03/31/2013 Elsevier Interactive Patient Education  2017 [employer]  

## 2016-01-15 ENCOUNTER — Other Ambulatory Visit: Payer: Self-pay | Admitting: Family Medicine

## 2016-01-15 DIAGNOSIS — J0110 Acute frontal sinusitis, unspecified: Secondary | ICD-10-CM

## 2016-03-29 ENCOUNTER — Ambulatory Visit (INDEPENDENT_AMBULATORY_CARE_PROVIDER_SITE_OTHER): Payer: 59 | Admitting: Physician Assistant

## 2016-03-29 VITALS — BP 114/74 | HR 68 | Temp 98.2°F | Resp 17 | Ht 60.5 in | Wt 144.0 lb

## 2016-03-29 DIAGNOSIS — R350 Frequency of micturition: Secondary | ICD-10-CM

## 2016-03-29 LAB — POCT URINALYSIS DIP (MANUAL ENTRY)
BILIRUBIN UA: NEGATIVE
Bilirubin, UA: NEGATIVE
Glucose, UA: NEGATIVE
Nitrite, UA: NEGATIVE
PROTEIN UA: NEGATIVE
SPEC GRAV UA: 1.01 (ref 1.030–1.035)
UROBILINOGEN UA: 0.2 (ref ?–2.0)
pH, UA: 7 (ref 5.0–8.0)

## 2016-03-29 MED ORDER — NITROFURANTOIN MONOHYD MACRO 100 MG PO CAPS: 100.0000 mg | ORAL_CAPSULE | Freq: Two times a day (BID) | ORAL | 0 refills | Status: AC

## 2016-03-29 NOTE — Progress Notes (Signed)
Patient ID: Alison Reed, female    DOB: 1975/04/08, 41 y.o.   MRN: [id number]  PCP: Porfirio Oar, PA-C  Chief Complaint  Patient presents with  . Dysuria    Subjective:   Presents for evaluation of urinary discomfort for 3-4 days.  Doesn't quite sting, it's "just weird and uncomfortable."  More frequent urination. Feels like there is something inside, and she has "to pee around it." Some urgency, intermittent.  Symptoms began the day after intercourse with her husband. Monogamous sex. She is s/p BTL. No atypical vaginal discharge, no odor. No vaginal itching. Baseline intermittent itching near the anus, for years.   Review of Systems As above.    Patient Active Problem List   Diagnosis Date Noted  . Seasonal allergic rhinitis 04/14/2015  . Deviated nasal septum 04/14/2015  . BMI 27.0-27.9,adult 03/13/2014  . Anemia   . Hx of dysmenorrhea 04/16/2007  . History of rectal bleeding 02/06/2004     Prior to Admission medications   Medication Sig Start Date End Date Taking? Authorizing Provider  albuterol (PROVENTIL HFA;VENTOLIN HFA) 108 (90 BASE) MCG/ACT inhaler Inhale into the lungs every 6 (six) hours as needed for wheezing or shortness of breath (once or twice a year).   Yes Historical Provider, MD  azelastine (ASTELIN) 0.1 % nasal spray USE 1-2 SPRAYS IN EACH NOSTRIL TWICE A DAY AS NEEDED NASALLY 30 DAY(S) 04/03/15  Yes Historical Provider, MD  fluticasone (FLONASE) 50 MCG/ACT nasal spray PLACE 2 SPRAYS INTO BOTH NOSTRILS DAILY. 01/16/16  Yes Rayn Shorb, PA-C  Fluticasone Furoate (ARNUITY ELLIPTA) 100 MCG/ACT AEPB TAKE 1 PUFF BY MOUTH EVERY DAY 04/02/15  Yes Historical Provider, MD  levocetirizine (XYZAL) 5 MG tablet TAKE 1 TABLET IN THE EVENING ONCE A DAY ORALLY 04/02/15  Yes Historical Provider, MD  montelukast (SINGULAIR) 10 MG tablet Take 10 mg by mouth at bedtime.   Yes Historical Provider, MD  sodium chloride (OCEAN) 0.65 % nasal spray Place 1  spray into the nose as needed. allergies   Yes Historical Provider, MD     No Known Allergies     Objective:  Physical Exam  Constitutional: She is oriented to person, place, and time. She appears well-developed and well-nourished. No distress.  BP 114/74   Pulse 68   Temp 98.2 F (36.8 C) (Oral)   Resp 17   Ht 5' 0.5" (1.537 m)   Wt 144 lb (65.3 kg)   LMP 03/07/2016 (Approximate)   SpO2 98%   BMI 27.66 kg/m    HENT:  Head: Normocephalic and atraumatic.  Cardiovascular: Normal rate, regular rhythm and normal heart sounds.   Pulmonary/Chest: Effort normal and breath sounds normal.  Abdominal: Soft. Normal appearance and bowel sounds are normal. She exhibits no distension and no mass. There is no hepatosplenomegaly. There is tenderness (mild) in the suprapubic area. There is no rigidity, no rebound, no guarding, no CVA tenderness, no tenderness at McBurney's point and negative Murphy's sign. No hernia.  Musculoskeletal: Normal range of motion.       Lumbar back: Normal.  Neurological: She is alert and oriented to person, place, and time.  Skin: Skin is warm and dry. No rash noted. She is not diaphoretic. No pallor.  Psychiatric: She has a normal mood and affect. Her speech is normal and behavior is normal. Judgment normal.    Results for orders placed or performed in visit on 03/29/16  POCT urinalysis dipstick  Result Value Ref Range   Color, UA  yellow yellow   Clarity, UA clear clear   Glucose, UA negative negative   Bilirubin, UA negative negative   Ketones, POC UA negative negative   Spec Grav, UA 1.010 1.030 - 1.035   Blood, UA trace-intact (A) negative   pH, UA 7.0 5.0 - 8.0   Protein Ur, POC negative negative   Urobilinogen, UA 0.2 Negative - 2.0   Nitrite, UA Negative Negative   Leukocytes, UA Trace (A) Negative       Assessment & Plan:   1. Increased urinary frequency Treat empirically for suspected UTI. Await UCx. Supportive care.  Anticipatory guidance.   RTC if symptoms worsen/persist. - POCT urinalysis dipstick - nitrofurantoin, macrocrystal-monohydrate, (MACROBID) 100 MG capsule; Take 1 capsule (100 mg total) by mouth 2 (two) times daily.  Dispense: 14 capsule; Refill: 0 - Urine culture   Fernande Brashelle S. Dexter Signor, PA-C Physician Assistant-Certified Primary Care at [facility]

## 2016-03-29 NOTE — Patient Instructions (Signed)
     IF you received an x-ray today, you will receive an invoice from [facility]. Please contact [facility] at [phone] with questions or concerns regarding your invoice.   IF you received labwork today, you will receive an invoice from [facility]. Please contact [facility] at [phone] with questions or concerns regarding your invoice.   Our billing staff will not be able to assist you with questions regarding bills from these companies.  You will be contacted with the lab results as soon as they are available. The fastest way to get your results is to activate your My Chart account. Instructions are located on the last page of this paperwork. If you have not heard from us regarding the results in 2 weeks, please contact this office.     

## 2016-03-30 ENCOUNTER — Ambulatory Visit: Payer: 59

## 2016-07-24 ENCOUNTER — Encounter: Payer: Self-pay | Admitting: Physician Assistant

## 2016-09-07 ENCOUNTER — Encounter: Payer: Self-pay | Admitting: Emergency Medicine

## 2016-09-07 ENCOUNTER — Ambulatory Visit (INDEPENDENT_AMBULATORY_CARE_PROVIDER_SITE_OTHER): Payer: 59 | Admitting: Emergency Medicine

## 2016-09-07 VITALS — BP 111/69 | HR 78 | Temp 99.0°F | Resp 16 | Ht 59.5 in | Wt 140.2 lb

## 2016-09-07 DIAGNOSIS — R059 Cough, unspecified: Secondary | ICD-10-CM

## 2016-09-07 DIAGNOSIS — J069 Acute upper respiratory infection, unspecified: Secondary | ICD-10-CM | POA: Diagnosis not present

## 2016-09-07 DIAGNOSIS — R05 Cough: Secondary | ICD-10-CM

## 2016-09-07 MED ORDER — AZITHROMYCIN 250 MG PO TABS: ORAL_TABLET | ORAL | 0 refills | Status: DC

## 2016-09-07 MED ORDER — BENZONATATE 200 MG PO CAPS: 200.0000 mg | ORAL_CAPSULE | Freq: Two times a day (BID) | ORAL | 0 refills | Status: DC | PRN

## 2016-09-07 MED ORDER — PROMETHAZINE-CODEINE 6.25-10 MG/5ML PO SYRP: 5.0000 mL | ORAL_SOLUTION | Freq: Every evening | ORAL | 0 refills | Status: DC | PRN

## 2016-09-07 NOTE — Patient Instructions (Addendum)
     IF you received an x-ray today, you will receive an invoice from [facility]. Please contact [facility] at [phone] with questions or concerns regarding your invoice.   IF you received labwork today, you will receive an invoice from [facility]. Please contact [facility] at [phone] with questions or concerns regarding your invoice.   Our billing staff will not be able to assist you with questions regarding bills from these companies.  You will be contacted with the lab results as soon as they are available. The fastest way to get your results is to activate your My Chart account. Instructions are located on the last page of this paperwork. If you have not heard from us regarding the results in 2 weeks, please contact this office.     Upper Respiratory Infection, Adult Most upper respiratory infections (URIs) are caused by a virus. A URI affects the nose, throat, and upper air passages. The most common type of URI is often called "the common cold." Follow these instructions at home:  Take medicines only as told by your doctor.  Gargle warm saltwater or take cough drops to comfort your throat as told by your doctor.  Use a warm mist humidifier or inhale steam from a shower to increase air moisture. This may make it easier to breathe.  Drink enough fluid to keep your pee (urine) clear or pale yellow.  Eat soups and other clear broths.  Have a healthy diet.  Rest as needed.  Go back to work when your fever is gone or your doctor says it is okay. ? You may need to stay home longer to avoid giving your URI to others. ? You can also wear a face mask and wash your hands often to prevent spread of the virus.  Use your inhaler more if you have asthma.  Do not use any tobacco products, including cigarettes, chewing tobacco, or electronic cigarettes. If you need help quitting, ask your doctor. Contact a doctor if:  You are getting worse, not better.  Your  symptoms are not helped by medicine.  You have chills.  You are getting more short of breath.  You have brown or red mucus.  You have yellow or brown discharge from your nose.  You have pain in your face, especially when you bend forward.  You have a fever.  You have puffy (swollen) neck glands.  You have pain while swallowing.  You have white areas in the back of your throat. Get help right away if:  You have very bad or constant: ? Headache. ? Ear pain. ? Pain in your forehead, behind your eyes, and over your cheekbones (sinus pain). ? Chest pain.  You have long-lasting (chronic) lung disease and any of the following: ? Wheezing. ? Long-lasting cough. ? Coughing up blood. ? A change in your usual mucus.  You have a stiff neck.  You have changes in your: ? Vision. ? Hearing. ? Thinking. ? Mood. This information is not intended to replace advice given to you by your health care provider. Make sure you discuss any questions you have with your health care provider. Document Released: 06/12/2007 Document Revised: 08/27/2015 Document Reviewed: 03/31/2013 Elsevier Interactive Patient Education  2018 Elsevier Inc.  

## 2016-09-07 NOTE — Progress Notes (Signed)
Alison Reed 41 y.o.   Chief Complaint  Patient presents with  . Cough    x 2 weeks  . Nasal Congestion  . Fatigue    HISTORY OF PRESENT ILLNESS: This is a 42 y.o. female complaining of cough and congestion x 5 days.  Cough  This is a new problem. The current episode started in the past 7 days. The problem has been gradually worsening. The problem occurs every few minutes. The cough is productive of sputum. Pertinent negatives include no chest pain, chills, ear congestion, ear pain, fever, hemoptysis, myalgias, nasal congestion, rash, sore throat, shortness of breath or wheezing.     Prior to Admission medications   Medication Sig Start Date End Date Taking? Authorizing Provider  albuterol (PROVENTIL HFA;VENTOLIN HFA) 108 (90 BASE) MCG/ACT inhaler Inhale into the lungs every 6 (six) hours as needed for wheezing or shortness of breath (once or twice a year).    [provider]  azelastine (ASTELIN) 0.1 % nasal spray USE 1-2 SPRAYS IN EACH NOSTRIL TWICE A DAY AS NEEDED NASALLY 30 DAY(S) 04/03/15   [provider]  fluticasone (FLONASE) 50 MCG/ACT nasal spray PLACE 2 SPRAYS INTO BOTH NOSTRILS DAILY. 01/16/16   Jeffery, Chelle, PA-C  Fluticasone Furoate (ARNUITY ELLIPTA) 100 MCG/ACT AEPB TAKE 1 PUFF BY MOUTH EVERY DAY 04/02/15   [provider]  levocetirizine (XYZAL) 5 MG tablet TAKE 1 TABLET IN THE EVENING ONCE A DAY ORALLY 04/02/15   [provider]  montelukast (SINGULAIR) 10 MG tablet Take 10 mg by mouth at bedtime.    [provider]  sodium chloride (OCEAN) 0.65 % nasal spray Place 1 spray into the nose as needed. allergies    [provider]    No Known Allergies  Patient Active Problem List   Diagnosis Date Noted  . Seasonal allergic rhinitis 04/14/2015  . Deviated nasal septum 04/14/2015  . BMI 27.0-27.9,adult 03/13/2014  . Anemia   . Hx of dysmenorrhea 04/16/2007  . History of rectal bleeding 02/06/2004    Past  Medical History:  Diagnosis Date  . Diabetes mellitus without complication (HCC)   . Failed induction, delivered, current hospitalization 01/04/2012   Induction for GDM-diet controlled. Failed VBAC.  [name] GDM (gestational diabetes mellitus) 10/21/2011   [place] q 2-4 weeks.   Pt needs NST 2x/wk or BPP weekly only if she needs meds to control BS   . Gestational diabetes    diet controlled  . Headache(784.0)    SINUS;ALLERGY RELATED  . History of cesarean delivery - 36wks, in labor, breech - w [facility] 06/10/2011   2 layer closure per [facility]   . History of chicken pox   . History of rectal bleeding 02/06/04  . Infection    YEAST;NOT FREQ  . PMS (premenstrual syndrome)   . Postpartum anemia 01/06/2012  . Premature baby    @ 30 WKS;HAD SEVERAL SURGERIES;HAD COLOSTOMY  . Status post repeat low transverse cesarean section 01/04/2012   Emergency c/s for NRFHT; failed induction. Failed VBAC.  . Status post repeat low transverse cesarean section 01/04/2012   Emergency c/s for NRFHT; failed induction. Failed VBAC.   . Status post tubal ligation at time of delivery, current hospitalization 01/04/2012    Past Surgical History:  Procedure Laterality Date  . ABDOMINAL SURGERY     infant abdomen reconstruction  . ABDOMINAL SURGERY     revise scar  . CESAREAN SECTION  10/30/2009  . CESAREAN SECTION WITH BILATERAL TUBAL LIGATION  01/04/2012  Procedure: CESAREAN SECTION WITH BILATERAL TUBAL LIGATION;  Surgeon: [name], MD;  Location: [facility];  Service: Obstetrics;;  Repeat Cesarean Section Delivery Baby Girl @ 0503, apgars 8/9, Bilateral Tubal Ligation  . COLOSTOMY    . WISDOM TOOTH EXTRACTION      Social History   Social History  . Marital status: Married    Spouse name: JEFF Denman  . Number of children: 1  . Years of education: 3313   Occupational History  . SAHM   . [employer] [place] [place]     [employer] for [employer] (Works from home)   Social History Main Topics  . Smoking status:  Never Smoker  . Smokeless tobacco: Never Used  . Alcohol use No     Comment: SOCIALLY OR ON HOLIDAYS ONLY ;2-3 GLASSES OF WINE/YEAR  . Drug use: No  . Sexual activity: Yes    Partners: Male    Birth control/ protection: None     Comment: currently pregnant   Other Topics Concern  . Not on file   Social History Narrative   Lives with her husband and their two children.    Family History  Problem Relation Age of Onset  . Adopted: Yes  . Cancer Father   . Heart disease Maternal Grandmother   . Thyroid disease Maternal Grandmother      Review of Systems  Constitutional: Negative for chills and fever.  HENT: Negative for ear pain and sore throat.   Respiratory: Positive for cough. Negative for hemoptysis, shortness of breath and wheezing.   Cardiovascular: Negative for chest pain.  Musculoskeletal: Negative for myalgias.  Skin: Negative for rash.  All other systems reviewed and are negative.  Vitals:   09/07/16 1553  BP: 111/69  Pulse: 78  Resp: 16  Temp: 99 F (37.2 C)  SpO2: 99%     Physical Exam  Constitutional: She is oriented to person, place, and time. She appears well-developed and well-nourished.  HENT:  Head: Normocephalic and atraumatic.  Right Ear: External ear normal.  Left Ear: External ear normal.  Nose: Nose normal.  Mouth/Throat: Oropharynx is clear and moist.  Eyes: Pupils are equal, round, and reactive to light. Conjunctivae and EOM are normal.  Neck: Normal range of motion. Neck supple. No JVD present. No thyromegaly present.  Cardiovascular: Normal rate, regular rhythm and normal heart sounds.   Pulmonary/Chest: Effort normal and breath sounds normal.  Abdominal: Soft. Bowel sounds are normal.  Musculoskeletal: Normal range of motion.  Neurological: She is alert and oriented to person, place, and time.  Skin: Skin is warm and dry. Capillary refill takes less than 2 seconds. No rash noted.  Psychiatric: She has a normal mood and affect. Her  behavior is normal.  Vitals reviewed.    ASSESSMENT & PLAN: Alison Reed was seen today for cough, nasal congestion and fatigue.  Diagnoses and all orders for this visit:  Acute upper respiratory infection  Cough  Other orders -     azithromycin (ZITHROMAX) 250 MG tablet; Sig as indicated -     promethazine-codeine (PHENERGAN WITH CODEINE) 6.25-10 MG/5ML syrup; Take 5 mLs by mouth at bedtime as needed for cough. -     benzonatate (TESSALON) 200 MG capsule; Take 1 capsule (200 mg total) by mouth 2 (two) times daily as needed for cough.    Patient Instructions       IF you received an x-ray today, you will receive an invoice from [facility]. Please contact [facility] at  [phone] with questions or concerns regarding your invoice.   IF you received labwork today, you will receive an invoice from [facility]. Please contact [facility] at [phone] with questions or concerns regarding your invoice.   Our billing staff will not be able to assist you with questions regarding bills from these companies.  You will be contacted with the lab results as soon as they are available. The fastest way to get your results is to activate your My Chart account. Instructions are located on the last page of this paperwork. If you have not heard from [place] regarding the results in 2 weeks, please contact this office.     Upper Respiratory Infection, Adult Most upper respiratory infections (URIs) are caused by a virus. A URI affects the nose, throat, and upper air passages. The most common type of URI is often called "the common cold." Follow these instructions at home:  Take medicines only as told by your doctor.  Gargle warm saltwater or take cough drops to comfort your throat as told by your doctor.  Use a warm mist humidifier or inhale steam from a shower to increase air moisture. This may make it easier to breathe.  Drink enough fluid to keep your pee (urine) clear or pale  yellow.  Eat soups and other clear broths.  Have a healthy diet.  Rest as needed.  Go back to work when your fever is gone or your doctor says it is okay. ? You may need to stay home longer to avoid giving your URI to others. ? You can also wear a face mask and wash your hands often to prevent spread of the virus.  Use your inhaler more if you have asthma.  Do not use any tobacco products, including cigarettes, chewing tobacco, or electronic cigarettes. If you need help quitting, ask your doctor. Contact a doctor if:  You are getting worse, not better.  Your symptoms are not helped by medicine.  You have chills.  You are getting more short of breath.  You have brown or red mucus.  You have yellow or brown discharge from your nose.  You have pain in your face, especially when you bend forward.  You have a fever.  You have puffy (swollen) neck glands.  You have pain while swallowing.  You have white areas in the back of your throat. Get help right away if:  You have very bad or constant: ? Headache. ? Ear pain. ? Pain in your forehead, behind your eyes, and over your cheekbones (sinus pain). ? Chest pain.  You have long-lasting (chronic) lung disease and any of the following: ? Wheezing. ? Long-lasting cough. ? Coughing up blood. ? A change in your usual mucus.  You have a stiff neck.  You have changes in your: ? Vision. ? Hearing. ? Thinking. ? Mood. This information is not intended to replace advice given to you by your health care provider. Make sure you discuss any questions you have with your health care provider. Document Released: 06/12/2007 Document Revised: 08/27/2015 Document Reviewed: 03/31/2013 Elsevier Interactive Patient Education  2018 Elsevier Inc.      Edwina Barth, MD [facility]

## 2017-01-23 ENCOUNTER — Other Ambulatory Visit: Payer: Self-pay | Admitting: Obstetrics and Gynecology

## 2017-01-23 DIAGNOSIS — Z139 Encounter for screening, unspecified: Secondary | ICD-10-CM

## 2017-01-30 ENCOUNTER — Other Ambulatory Visit: Payer: Self-pay

## 2017-01-30 ENCOUNTER — Ambulatory Visit: Payer: 59 | Admitting: Physician Assistant

## 2017-01-30 ENCOUNTER — Encounter: Payer: Self-pay | Admitting: Physician Assistant

## 2017-01-30 VITALS — BP 110/70 | HR 73 | Temp 99.3°F | Resp 18 | Ht 60.24 in | Wt 148.2 lb

## 2017-01-30 DIAGNOSIS — R0981 Nasal congestion: Secondary | ICD-10-CM

## 2017-01-30 DIAGNOSIS — J069 Acute upper respiratory infection, unspecified: Secondary | ICD-10-CM | POA: Diagnosis not present

## 2017-01-30 DIAGNOSIS — J029 Acute pharyngitis, unspecified: Secondary | ICD-10-CM

## 2017-01-30 DIAGNOSIS — R05 Cough: Secondary | ICD-10-CM

## 2017-01-30 DIAGNOSIS — R059 Cough, unspecified: Secondary | ICD-10-CM

## 2017-01-30 MED ORDER — MUCINEX DM MAXIMUM STRENGTH 60-1200 MG PO TB12: 1.0000 | ORAL_TABLET | Freq: Two times a day (BID) | ORAL | 0 refills | Status: DC

## 2017-01-30 MED ORDER — HYDROCODONE-HOMATROPINE 5-1.5 MG/5ML PO SYRP: 5.0000 mL | ORAL_SOLUTION | Freq: Three times a day (TID) | ORAL | 0 refills | Status: DC | PRN

## 2017-01-30 MED ORDER — AZITHROMYCIN 250 MG PO TABS: ORAL_TABLET | ORAL | 0 refills | Status: DC

## 2017-01-30 MED ORDER — BENZONATATE 100 MG PO CAPS: 100.0000 mg | ORAL_CAPSULE | Freq: Three times a day (TID) | ORAL | 0 refills | Status: DC | PRN

## 2017-01-30 NOTE — Progress Notes (Signed)
MRN: [id number] DOB: 01-Sep-1975  Subjective:   Alison Reed is a 42 y.o. female presenting for chief complaint of Cough (X 1 1/2 weeks) .  Reports 1.5 week history of illness. Started out as scratchy throat and congestion, then the cough came. It is mostly dry. Started feeling better a few days ago but then woke up yesterday and she felt worse. Has a little bit of shortness of breath but her albuterol inhaler helps. Denies fever, wheezing,  sinus pain, ear pain and chest pain, nausea, vomiting, abdominal pain and diarrhea. Has tried sudafed and cough syrup with no full relief. Takes daily medication for seasonal allergies. Has had sick contact with daughter. Has history of seasonal allergies, has history of seasonal induced asthma. Patient has had flu shot this season. Denies smoking.Denies any other aggravating or relieving factors, no other questions or concerns.  Alison Reed has a current medication list which includes the following prescription(s): albuterol, azelastine, fluticasone, fluticasone furoate, levocetirizine, montelukast, azithromycin, benzonatate, promethazine-codeine, and sodium chloride. Also has No Known Allergies.  Alison Reed  has a past medical history of Diabetes mellitus without complication (HCC), Failed induction, delivered, current hospitalization (01/04/2012), GDM (gestational diabetes mellitus) (10/21/2011), Gestational diabetes, Headache(784.0), History of cesarean delivery - 36wks, in labor, breech - w [facility] (06/10/2011), History of chicken pox, History of rectal bleeding (02/06/04), Infection, PMS (premenstrual syndrome), Postpartum anemia (01/06/2012), Premature baby, Status post repeat low transverse cesarean section (01/04/2012), Status post repeat low transverse cesarean section (01/04/2012), and Status post tubal ligation at time of delivery, current hospitalization (01/04/2012). Also  has a past surgical history that includes Wisdom tooth extraction; Colostomy;  Cesarean section (10/30/2009); Abdominal surgery; Abdominal surgery; and Cesarean section with bilateral tubal ligation (01/04/2012).   Objective:   Vitals: BP 110/70 (BP Location: Right Arm, Patient Position: Sitting, Cuff Size: Normal)   Pulse 73   Temp 99.3 F (37.4 C) (Oral)   Resp 18   Ht 5' 0.24" (1.53 m)   Wt 148 lb 3.2 oz (67.2 kg)   LMP 01/25/2017 (Exact Date)   SpO2 97%   BMI 28.72 kg/m   Physical Exam  Constitutional: She is oriented to person, place, and time. She appears well-developed and well-nourished.  HENT:  Head: Normocephalic and atraumatic.  Right Ear: Tympanic membrane, external ear and ear canal normal.  Left Ear: Tympanic membrane, external ear and ear canal normal.  Nose: Mucosal edema present. Right sinus exhibits no maxillary sinus tenderness and no frontal sinus tenderness. Left sinus exhibits no maxillary sinus tenderness and no frontal sinus tenderness.  Mouth/Throat: Uvula is midline and mucous membranes are normal. Posterior oropharyngeal erythema present. No tonsillar exudate.  Eyes: Conjunctivae are normal.  Neck: Normal range of motion.  Pulmonary/Chest: Effort normal and breath sounds normal. She has no wheezes. She has no rhonchi. She has no rales.  Lymphadenopathy:       Head (right side): No submental, no submandibular, no tonsillar, no preauricular, no posterior auricular and no occipital adenopathy present.       Head (left side): No submental, no submandibular, no tonsillar, no preauricular, no posterior auricular and no occipital adenopathy present.    She has no cervical adenopathy.       Right: No supraclavicular adenopathy present.       Left: No supraclavicular adenopathy present.  Neurological: She is alert and oriented to person, place, and time.  Skin: Skin is warm and dry.  Psychiatric: She has a normal mood and affect.  No results found for this or any previous visit (from the past 24 hour(s)).  Assessment and Plan :  1.  Cough - HYDROcodone-homatropine (HYCODAN) 5-1.5 MG/5ML syrup; Take 5 mLs by mouth every 8 (eight) hours as needed for cough.  Dispense: 120 mL; Refill: 0 - azithromycin (ZITHROMAX) 250 MG tablet; Take 2 tabs PO x 1 dose, then 1 tab PO QD x 4 days  Dispense: 6 tablet; Refill: 0 - benzonatate (TESSALON) 100 MG capsule; Take 1-2 capsules (100-200 mg total) by mouth 3 (three) times daily as needed for cough.  Dispense: 40 capsule; Refill: 0 - Dextromethorphan-Guaifenesin (MUCINEX DM MAXIMUM STRENGTH) 60-1200 MG TB12; Take 1 tablet by mouth every 12 (twelve) hours.  Dispense: 20 each; Refill: 0 2. Sore throat 3. Nasal congestion 4. Acute upper respiratory infection Patient is overall well-appearing.  Vital stable, she is afebrile.  Lungs CTAB.  Duration of illness has been about 1.5 weeks.  Likely still viral etiology.  Recommend symptomatic treatment at this time.  If no improvement in 3-5 days, given printed Rx for an antibiotic to cover for underlying bacterial etiology. Follow up here if no improvement, symptoms worsen, or as needed.   Benjiman CoreBrittany Woodie Degraffenreid, PA-C  [facility] at [facility] 01/30/2017 12:24 PM

## 2017-01-30 NOTE — Patient Instructions (Addendum)
- We will treat this as a respiratory viral infection at this time.  - I recommend you rest, drink plenty of fluids, eat light meals including soups.  - You may use cough syrup at night for your cough and sore throat, Tessalon pearls during the day. If you want to try and get stuff up, take mucinex-dm, if you want to suppress it, take tessalon perlses.  Be aware that cough syrup can definitely make you drowsy and sleepy so do not drive or operate any heavy machinery if it is affecting you during the day.  - If no improvement in 3-5 days, I have given you a Rx for a zpack to get fill to cover for any underlying bacterial infection. If you take this and are not feeling better or develop new concerning symptoms, please return to office.    Upper Respiratory Infection, Adult Most upper respiratory infections (URIs) are caused by a virus. A URI affects the nose, throat, and upper air passages. The most common type of URI is often called "the common cold." Follow these instructions at home:  Take medicines only as told by your doctor.  Gargle warm saltwater or take cough drops to comfort your throat as told by your doctor.  Use a warm mist humidifier or inhale steam from a shower to increase air moisture. This may make it easier to breathe.  Drink enough fluid to keep your pee (urine) clear or pale yellow.  Eat soups and other clear broths.  Have a healthy diet.  Rest as needed.  Go back to work when your fever is gone or your doctor says it is okay. ? You may need to stay home longer to avoid giving your URI to others. ? You can also wear a face mask and wash your hands often to prevent spread of the virus.  Use your inhaler more if you have asthma.  Do not use any tobacco products, including cigarettes, chewing tobacco, or electronic cigarettes. If you need help quitting, ask your doctor. Contact a doctor if:  You are getting worse, not better.  Your symptoms are not helped by  medicine.  You have chills.  You are getting more short of breath.  You have brown or red mucus.  You have yellow or brown discharge from your nose.  You have pain in your face, especially when you bend forward.  You have a fever.  You have puffy (swollen) neck glands.  You have pain while swallowing.  You have white areas in the back of your throat. Get help right away if:  You have very bad or constant: ? Headache. ? Ear pain. ? Pain in your forehead, behind your eyes, and over your cheekbones (sinus pain). ? Chest pain.  You have long-lasting (chronic) lung disease and any of the following: ? Wheezing. ? Long-lasting cough. ? Coughing up blood. ? A change in your usual mucus.  You have a stiff neck.  You have changes in your: ? Vision. ? Hearing. ? Thinking. ? Mood. This information is not intended to replace advice given to you by your health care provider. Make sure you discuss any questions you have with your health care provider. Document Released: 06/12/2007 Document Revised: 08/27/2015 Document Reviewed: 03/31/2013 Elsevier Interactive Patient Education  2018 [employer]    IF you received an x-ray today, you will receive an invoice from [facility]. Please contact [facility] at [phone] with questions or concerns regarding your invoice.   IF you received  labwork today, you will receive an invoice from [facility]. Please contact [facility] at [phone] with questions or concerns regarding your invoice.   Our billing staff will not be able to assist you with questions regarding bills from these companies.  You will be contacted with the lab results as soon as they are available. The fastest way to get your results is to activate your My Chart account. Instructions are located on the last page of this paperwork. If you have not heard from [place] regarding the results in 2 weeks, please contact this office.

## 2017-04-07 ENCOUNTER — Ambulatory Visit
Admission: RE | Admit: 2017-04-07 | Discharge: 2017-04-07 | Disposition: A | Discharge status: Home / Self Care | Payer: 59 | Source: Ambulatory Visit | Attending: Obstetrics and Gynecology | Admitting: Obstetrics and Gynecology

## 2017-04-07 DIAGNOSIS — Z139 Encounter for screening, unspecified: Secondary | ICD-10-CM

## 2017-04-10 ENCOUNTER — Encounter: Payer: Self-pay | Admitting: Physician Assistant

## 2017-06-07 ENCOUNTER — Encounter: Payer: Self-pay | Admitting: Physician Assistant

## 2017-06-07 ENCOUNTER — Other Ambulatory Visit: Payer: Self-pay

## 2017-06-07 ENCOUNTER — Ambulatory Visit: Payer: 59 | Admitting: Physician Assistant

## 2017-06-07 VITALS — BP 98/60 | HR 67 | Temp 98.2°F | Ht 60.0 in | Wt 134.8 lb

## 2017-06-07 DIAGNOSIS — R300 Dysuria: Secondary | ICD-10-CM | POA: Diagnosis not present

## 2017-06-07 DIAGNOSIS — R350 Frequency of micturition: Secondary | ICD-10-CM

## 2017-06-07 LAB — POC MICROSCOPIC URINALYSIS (UMFC): Mucus: ABSENT

## 2017-06-07 LAB — POCT URINALYSIS DIP (MANUAL ENTRY)
BILIRUBIN UA: NEGATIVE
BILIRUBIN UA: NEGATIVE mg/dL
Glucose, UA: NEGATIVE mg/dL
NITRITE UA: NEGATIVE
Protein Ur, POC: NEGATIVE mg/dL
Spec Grav, UA: 1.015 (ref 1.010–1.025)
Urobilinogen, UA: 0.2 E.U./dL
pH, UA: 7.5 (ref 5.0–8.0)

## 2017-06-07 MED ORDER — SULFAMETHOXAZOLE-TRIMETHOPRIM 800-160 MG PO TABS: 1.0000 | ORAL_TABLET | Freq: Two times a day (BID) | ORAL | 0 refills | Status: DC

## 2017-06-07 MED ORDER — PHENAZOPYRIDINE HCL 200 MG PO TABS: 200.0000 mg | ORAL_TABLET | Freq: Three times a day (TID) | ORAL | 0 refills | Status: DC | PRN

## 2017-06-07 NOTE — Progress Notes (Signed)
Patient ID: Alison Reed, female    DOB: Sep 28, 1975, 42 y.o.   MRN: [id number]  PCP: Porfirio Oar, PA-C  Chief Complaint  Patient presents with  . Urinary Tract Infection    pain and frequency with uraination going since yesterday    Subjective:   Presents for evaluation of possible UTI.  Burning with urination and increased urinary frequency began yesterday at 4 am.  No hematuria. No atypical vaginal discharge. No fever, chills. No back pain. Very low pelvic pain following micturition.  Review of Systems As above.    Patient Active Problem List   Diagnosis Date Noted  . Cough 09/07/2016  . Acute upper respiratory infection 09/07/2016  . Seasonal allergic rhinitis 04/14/2015  . Deviated nasal septum 04/14/2015  . BMI 27.0-27.9,adult 03/13/2014  . Anemia   . Hx of dysmenorrhea 04/16/2007  . History of rectal bleeding 02/06/2004     Prior to Admission medications   Medication Sig Start Date End Date Taking? Authorizing Provider  albuterol (PROVENTIL HFA;VENTOLIN HFA) 108 (90 BASE) MCG/ACT inhaler Inhale into the lungs every 6 (six) hours as needed for wheezing or shortness of breath (once or twice a year).   Yes [provider]  azelastine (ASTELIN) 0.1 % nasal spray USE 1-2 SPRAYS IN EACH NOSTRIL TWICE A DAY AS NEEDED NASALLY 30 DAY(S) 04/03/15  Yes [provider]  fluticasone (FLONASE) 50 MCG/ACT nasal spray PLACE 2 SPRAYS INTO BOTH NOSTRILS DAILY. 01/16/16  Yes Havanna Groner, PA-C  Fluticasone Furoate (ARNUITY ELLIPTA) 100 MCG/ACT AEPB TAKE 1 PUFF BY MOUTH EVERY DAY 04/02/15  Yes [provider]  levocetirizine (XYZAL) 5 MG tablet TAKE 1 TABLET IN THE EVENING ONCE A DAY ORALLY 04/02/15  Yes [provider]  montelukast (SINGULAIR) 10 MG tablet Take 10 mg by mouth at bedtime.   Yes [provider]  sodium chloride (OCEAN) 0.65 % nasal spray Place 1 spray into the nose as needed. allergies   Yes [provider]     No Known Allergies     Objective:  Physical Exam  Constitutional: She is oriented to person, place, and time. Vital signs are normal. She appears well-developed and well-nourished. No distress.  HENT:  Head: Normocephalic and atraumatic.  Cardiovascular: Normal rate, regular rhythm and normal heart sounds.  Pulmonary/Chest: Effort normal and breath sounds normal.  Abdominal: Soft. Normal appearance and bowel sounds are normal. She exhibits no distension and no mass. There is no hepatosplenomegaly. There is tenderness in the suprapubic area. There is no rigidity, no rebound, no guarding, no CVA tenderness, no tenderness at McBurney's point and negative Murphy's sign. No hernia.  Musculoskeletal: Normal range of motion.       Lumbar back: Normal.  Neurological: She is alert and oriented to person, place, and time.  Skin: Skin is warm and dry. No rash noted. She is not diaphoretic. No pallor.  Psychiatric: She has a normal mood and affect. Her speech is normal and behavior is normal. Judgment normal.    Results for orders placed or performed in visit on 06/07/17  POCT urinalysis dipstick  Result Value Ref Range   Color, UA yellow yellow   Clarity, UA clear clear   Glucose, UA negative negative mg/dL   Bilirubin, UA negative negative   Ketones, POC UA negative negative mg/dL   Spec Grav, UA 4.098 1.191 - 1.025   Blood, UA moderate (A) negative   pH, UA 7.5 5.0 - 8.0   Protein Ur,  POC negative negative mg/dL   Urobilinogen, UA 0.2 0.2 or 1.0 E.U./dL   Nitrite, UA Negative Negative   Leukocytes, UA Trace (A) Negative  POCT Microscopic Urinalysis ([facility])  Result Value Ref Range   WBC,UR,HPF,POC Moderate (A) None WBC/hpf   RBC,UR,HPF,POC Few (A) None RBC/hpf   Bacteria None None, Too numerous to count   Mucus Absent Absent   Epithelial Cells, UR Per Microscopy Few (A) None, Too numerous to count cells/hpf      Assessment & Plan:   1. Dysuria 2. Urinary  frequency Supportive care.  Anticipatory guidance.  RTC if symptoms worsen/persist. - POCT urinalysis dipstick - POCT Microscopic Urinalysis ([facility]) - Urine Culture - sulfamethoxazole-trimethoprim (BACTRIM DS,SEPTRA DS) 800-160 MG tablet; Take 1 tablet by mouth 2 (two) times daily.  Dispense: 10 tablet; Refill: 0 - phenazopyridine (PYRIDIUM) 200 MG tablet; Take 1 tablet (200 mg total) by mouth 3 (three) times daily as needed for pain.  Dispense: 10 tablet; Refill: 0     Return if symptoms worsen or fail to improve.   Fernande Brashelle S. Graci Hulce, PA-C Primary Care at [facility]

## 2017-06-07 NOTE — Patient Instructions (Signed)
     IF you received an x-ray today, you will receive an invoice from [facility]. Please contact [facility] at [phone] with questions or concerns regarding your invoice.   IF you received labwork today, you will receive an invoice from [facility]. Please contact [facility] at [phone] with questions or concerns regarding your invoice.   Our billing staff will not be able to assist you with questions regarding bills from these companies.  You will be contacted with the lab results as soon as they are available. The fastest way to get your results is to activate your My Chart account. Instructions are located on the last page of this paperwork. If you have not heard from us regarding the results in 2 weeks, please contact this office.     

## 2017-06-11 LAB — URINE CULTURE

## 2018-10-13 ENCOUNTER — Other Ambulatory Visit: Payer: Self-pay | Admitting: Physician Assistant

## 2018-10-13 DIAGNOSIS — Z1231 Encounter for screening mammogram for malignant neoplasm of breast: Secondary | ICD-10-CM

## 2018-12-11 ENCOUNTER — Ambulatory Visit
Admission: RE | Admit: 2018-12-11 | Discharge: 2018-12-11 | Disposition: A | Discharge status: Home / Self Care | Payer: 59 | Source: Ambulatory Visit | Attending: Physician Assistant | Admitting: Physician Assistant

## 2018-12-11 ENCOUNTER — Other Ambulatory Visit: Payer: Self-pay

## 2018-12-11 DIAGNOSIS — Z1231 Encounter for screening mammogram for malignant neoplasm of breast: Secondary | ICD-10-CM

## 2020-01-11 ENCOUNTER — Other Ambulatory Visit: Payer: Self-pay | Admitting: Physician Assistant

## 2020-01-11 DIAGNOSIS — Z1231 Encounter for screening mammogram for malignant neoplasm of breast: Secondary | ICD-10-CM

## 2020-02-18 ENCOUNTER — Other Ambulatory Visit: Payer: Self-pay

## 2020-02-18 ENCOUNTER — Ambulatory Visit
Admission: RE | Admit: 2020-02-18 | Discharge: 2020-02-18 | Disposition: A | Discharge status: Home / Self Care | Payer: BC Managed Care – PPO | Source: Ambulatory Visit | Attending: Physician Assistant | Admitting: Physician Assistant

## 2020-02-18 DIAGNOSIS — Z1231 Encounter for screening mammogram for malignant neoplasm of breast: Secondary | ICD-10-CM

## 2021-03-19 ENCOUNTER — Other Ambulatory Visit: Payer: Self-pay | Admitting: Physician Assistant

## 2021-03-19 DIAGNOSIS — Z1231 Encounter for screening mammogram for malignant neoplasm of breast: Secondary | ICD-10-CM

## 2021-04-05 ENCOUNTER — Ambulatory Visit: Payer: BC Managed Care – PPO

## 2021-05-15 ENCOUNTER — Ambulatory Visit
Admission: RE | Admit: 2021-05-15 | Discharge: 2021-05-15 | Disposition: A | Discharge status: Home / Self Care | Payer: Self-pay | Source: Ambulatory Visit | Attending: Physician Assistant | Admitting: Physician Assistant

## 2021-05-15 DIAGNOSIS — Z1231 Encounter for screening mammogram for malignant neoplasm of breast: Secondary | ICD-10-CM

## 2021-05-17 ENCOUNTER — Other Ambulatory Visit: Payer: Self-pay | Admitting: Physician Assistant

## 2021-05-17 DIAGNOSIS — R928 Other abnormal and inconclusive findings on diagnostic imaging of breast: Secondary | ICD-10-CM

## 2021-05-29 ENCOUNTER — Ambulatory Visit
Admission: RE | Admit: 2021-05-29 | Discharge: 2021-05-29 | Disposition: A | Discharge status: Home / Self Care | Payer: BC Managed Care – PPO | Source: Ambulatory Visit | Attending: Physician Assistant | Admitting: Physician Assistant

## 2021-05-29 DIAGNOSIS — R928 Other abnormal and inconclusive findings on diagnostic imaging of breast: Secondary | ICD-10-CM

## 2022-04-18 ENCOUNTER — Other Ambulatory Visit: Payer: Self-pay | Admitting: Physician Assistant

## 2022-04-18 DIAGNOSIS — Z0000 Encounter for general adult medical examination without abnormal findings: Secondary | ICD-10-CM

## 2022-05-27 ENCOUNTER — Ambulatory Visit: Payer: BC Managed Care – PPO

## 2022-06-07 ENCOUNTER — Ambulatory Visit: Payer: BC Managed Care – PPO

## 2022-07-24 ENCOUNTER — Encounter: Payer: Self-pay | Admitting: Gastroenterology

## 2022-08-08 ENCOUNTER — Other Ambulatory Visit: Payer: Self-pay | Admitting: Obstetrics and Gynecology

## 2022-09-03 ENCOUNTER — Ambulatory Visit (AMBULATORY_SURGERY_CENTER): Payer: BC Managed Care – PPO | Admitting: *Deleted

## 2022-09-03 VITALS — Ht 59.0 in | Wt 134.0 lb

## 2022-09-03 DIAGNOSIS — Z1211 Encounter for screening for malignant neoplasm of colon: Secondary | ICD-10-CM

## 2022-09-03 MED ORDER — NA SULFATE-K SULFATE-MG SULF 17.5-3.13-1.6 GM/177ML PO SOLN: 1.0000 | Freq: Once | ORAL | 0 refills | Status: AC

## 2022-09-03 NOTE — Progress Notes (Signed)
Pt's name and DOB verified at the beginning of the pre-visit.  Pt denies any difficulty with ambulating,sitting, laying down or rolling side to side Gave both [facility] main # and MD on call # prior to instructions.  No egg or soy allergy known to patient  No issues known to pt with past sedation with any surgeries or procedures Pt denies having issues being intubated Patient denies ever being intubated Pt has no issues moving head neck or swallowing No FH of Malignant Hyperthermia Pt is not on diet pills Pt is not on home 02  Pt is not on blood thinners  Pt denies issues with constipation  Pt is not on dialysis Pt denise any abnormal heart rhythms  Pt denies any upcoming cardiac testing Pt encouraged to use to use Singlecare or Goodrx to reduce cost  Patient's chart reviewed by Cathlyn Parsons CNRA prior to pre-visit and patient appropriate for the [facility].  Pre-visit completed and red dot placed by patient's name on their procedure day (on provider's schedule).  . Visit by phone Pt states weight is 134 lb Instructed pt why it is important to and  to call if they have any changes in health or new medications. Directed them to the # given and on instructions.   Pt states they will.  Instructions reviewed with pt and pt states understanding. Instructed to review again prior to procedure. Pt states they will.  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 She did have to have apox 10 feet of colon removed as an infant.

## 2022-09-04 ENCOUNTER — Telehealth: Payer: Self-pay | Admitting: Gastroenterology

## 2022-09-04 NOTE — Telephone Encounter (Signed)
error 

## 2022-09-13 ENCOUNTER — Ambulatory Visit: Payer: BC Managed Care – PPO

## 2022-09-18 ENCOUNTER — Encounter: Payer: Self-pay | Admitting: Gastroenterology

## 2022-09-23 ENCOUNTER — Encounter: Payer: BC Managed Care – PPO | Admitting: Gastroenterology

## 2022-10-01 ENCOUNTER — Ambulatory Visit (AMBULATORY_SURGERY_CENTER): Payer: BC Managed Care – PPO | Admitting: Gastroenterology

## 2022-10-01 ENCOUNTER — Encounter: Payer: Self-pay | Admitting: Gastroenterology

## 2022-10-01 VITALS — BP 101/60 | HR 56 | Temp 97.5°F | Resp 14 | Ht 59.0 in | Wt 134.0 lb

## 2022-10-01 DIAGNOSIS — Z1211 Encounter for screening for malignant neoplasm of colon: Secondary | ICD-10-CM | POA: Diagnosis present

## 2022-10-01 MED ORDER — SODIUM CHLORIDE 0.9 % IV SOLN: 500.0000 mL | Freq: Once | INTRAVENOUS | Status: DC

## 2022-10-01 NOTE — Progress Notes (Signed)
Pt's states no medical or surgical changes since previsit or office visit. 

## 2022-10-01 NOTE — Progress Notes (Signed)
Vss nad trans to pacu 

## 2022-10-01 NOTE — Patient Instructions (Signed)
Resume all of your previous medications today as ordered.  You will need another colonosocpy in 10 years.  YOU HAD AN ENDOSCOPIC PROCEDURE TODAY AT THE [facility]:   Refer to the procedure report that was given to you for any specific questions about what was found during the examination.  If the procedure report does not answer your questions, please call your gastroenterologist to clarify.  If you requested that your care partner not be given the details of your procedure findings, then the procedure report has been included in a sealed envelope for you to review at your convenience later.  YOU SHOULD EXPECT: Some feelings of bloating in the abdomen. Passage of more gas than usual.  Walking can help get rid of the air that was put into your GI tract during the procedure and reduce the bloating. If you had a lower endoscopy (such as a colonoscopy or flexible sigmoidoscopy) you may notice spotting of blood in your stool or on the toilet paper. If you underwent a bowel prep for your procedure, you may not have a normal bowel movement for a few days.  Please Note:  You might notice some irritation and congestion in your nose or some drainage.  This is from the oxygen used during your procedure.  There is no need for concern and it should clear up in a day or so.  SYMPTOMS TO REPORT IMMEDIATELY:  Following lower endoscopy (colonoscopy or flexible sigmoidoscopy):  Excessive amounts of blood in the stool  Significant tenderness or worsening of abdominal pains  Swelling of the abdomen that is new, acute  Fever of 100F or higher  For urgent or emergent issues, a gastroenterologist can be reached at any hour by calling [phone]. Do not use MyChart messaging for urgent concerns.    DIET:  We do recommend a small meal at first, but then you may proceed to your regular diet.  Drink plenty of fluids but you should avoid alcoholic beverages for 24 hours.  ACTIVITY:  You should plan to  take it easy for the rest of today and you should NOT DRIVE or use heavy machinery until tomorrow (because of the sedation medicines used during the test).    FOLLOW UP: Our staff will call the number listed on your records the next business day following your procedure.  We will call around 7:15- 8:00 am to check on you and address any questions or concerns that you may have regarding the information given to you following your procedure. If we do not reach you, we will leave a message.     If any biopsies were taken you will be contacted by phone or by letter within the next 1-3 weeks.  Please call us at [phone] if you have not heard about the biopsies in 3 weeks.    SIGNATURES/CONFIDENTIALITY: You and/or your care partner have signed paperwork which will be entered into your electronic medical record.  These signatures attest to the fact that that the information above on your After Visit Summary has been reviewed and is understood.  Full responsibility of the confidentiality of this discharge information lies with you and/or your care-partner.

## 2022-10-01 NOTE — Op Note (Signed)
[facility] Patient Name: Alison Reed Procedure Date: 10/01/2022 11:00 AM MRN: [id number] Endoscopist: Sherilyn Cooter L. Myrtie Neither , MD, [id number] Age: 47 Referring MD:  Date of Birth: [birth year] Gender: Female Account #: [id number] Procedure:                Colonoscopy Indications:              Screening for colorectal malignant neoplasm, This                            is the patient's first colonoscopy Procedure:                Pre-Anesthesia Assessment:                           - Prior to the procedure, a History and Physical                            was performed, and patient medications and                            allergies were reviewed. The patient's tolerance of                            previous anesthesia was also reviewed. The risks                            and benefits of the procedure and the sedation                            options and risks were discussed with the patient.                            All questions were answered, and informed consent                            was obtained. Prior Anticoagulants: The patient has                            taken no anticoagulant or antiplatelet agents. ASA                            Grade Assessment: II - A patient with mild systemic                            disease. After reviewing the risks and benefits,                            the patient was deemed in satisfactory condition to                            undergo the procedure.                           After obtaining informed consent, the colonoscope  was passed under direct vision. Throughout the                            procedure, the patient's blood pressure, pulse, and                            oxygen saturations were monitored continuously. The                            CF [id number] #[id number] was introduced through the anus                            and advanced to the the terminal ileum, with                             identification of the appendiceal orifice and IC                            valve. The colonoscopy was performed without                            difficulty. The patient tolerated the procedure                            well. The quality of the bowel preparation was                            excellent. The terminal ileum, ileocecal valve,                            appendiceal orifice, and rectum were photographed. Scope In: 11:07:24 AM Scope Out: 11:18:18 AM Scope Withdrawal Time: 0 hours 8 minutes 13 seconds  Total Procedure Duration: 0 hours 10 minutes 54 seconds  Findings:                 The perianal and digital rectal examinations were                            normal. (somewhat redundant skin fold)                           Repeat examination of right colon under NBI                            performed.                           There was evidence of a prior end-to-end                            colo-colonic anastomosis at the splenic flexure.                            This was patent and was characterized by healthy  appearing mucosa.                           The exam was otherwise without abnormality on                            direct and retroflexion views. Complications:            No immediate complications. Estimated Blood Loss:     Estimated blood loss: none. Impression:               - Patent end-to-end colo-colonic anastomosis,                            characterized by healthy appearing mucosa. 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                           - No specimens collected. Recommendation:           - Patient has a contact number available for                            emergencies.  The signs and symptoms of potential                            delayed complications were discussed with the                            patient. Return to normal activities tomorrow.                            Written discharge instructions were provided to the                            patient.                           - Resume previous diet.                           - Continue present medications.                           - Repeat colonoscopy in 10 years for screening  purposes. Lanisha Stepanian L. Myrtie Neither, MD 10/01/2022 11:23:58 AM This report has been signed electronically.

## 2022-10-01 NOTE — Progress Notes (Signed)
History and Physical:  This patient presents for endoscopic testing for: Encounter Diagnosis  Name Primary?   Special screening for malignant neoplasms, colon Yes    Average risk for colorectal cancer.  First screening exam.    Patient is otherwise without complaints or active issues today.   Past Medical History: Past Medical History:  Diagnosis Date   Bowel obstruction (HCC)    as a preemie   Failed induction, delivered, current hospitalization 01/04/2012   Induction for GDM-diet controlled. Failed VBAC.   GDM (gestational diabetes mellitus) 10/21/2011   [place] q 2-4 weeks.   Pt needs NST 2x/wk or BPP weekly only if she needs meds to control BS    Gestational diabetes    diet controlled   Headache(784.0)    SINUS;ALLERGY RELATED   History of cesarean delivery - 36wks, in labor, breech - w [facility] 06/10/2011   2 layer closure per [facility]    History of chicken pox    History of rectal bleeding 02/06/2004   Infection    YEAST;NOT FREQ   PMS (premenstrual syndrome)    Postpartum anemia 01/06/2012   Premature baby    @ 30 WKS;HAD SEVERAL SURGERIES;HAD COLOSTOMY   Status post repeat low transverse cesarean section 01/04/2012   Emergency c/s for NRFHT; failed induction. Failed VBAC.   Status post repeat low transverse cesarean section 01/04/2012   Emergency c/s for NRFHT; failed induction. Failed VBAC.    Status post tubal ligation at time of delivery, current hospitalization 01/04/2012     Past Surgical History: Past Surgical History:  Procedure Laterality Date   ABDOMINAL SURGERY     infant abdomen reconstruction   ABDOMINAL SURGERY     revise scar   CESAREAN SECTION  10/30/2009   CESAREAN SECTION WITH BILATERAL TUBAL LIGATION  01/04/2012   Procedure: CESAREAN SECTION WITH BILATERAL TUBAL LIGATION;  Surgeon: Michael Litter, MD;  Location: [facility];  Service: Obstetrics;;  Repeat Cesarean Section Delivery Baby Girl @ 0503, apgars 8/9, Bilateral Tubal Ligation   COLOSTOMY      WISDOM TOOTH EXTRACTION     States she had emergency abdominal surgery in infancy due to complications of prematurity.  Allergies: No Known Allergies  Outpatient Meds: Current Outpatient Medications  Medication Sig Dispense Refill   acetaminophen (TYLENOL) 325 MG tablet Take 325 mg by mouth every 6 (six) hours as needed for headache. Take as needed.     albuterol (PROVENTIL HFA;VENTOLIN HFA) 108 (90 BASE) MCG/ACT inhaler Inhale into the lungs every 6 (six) hours as needed for wheezing or shortness of breath (once or twice a year).     azelastine (ASTELIN) 0.1 % nasal spray USE 1-2 SPRAYS IN EACH NOSTRIL TWICE A DAY AS NEEDED NASALLY 30 DAY(S)     fluticasone (FLONASE) 50 MCG/ACT nasal spray PLACE 2 SPRAYS INTO BOTH NOSTRILS DAILY. 16 g 2   levocetirizine (XYZAL) 5 MG tablet TAKE 1 TABLET IN THE EVENING ONCE A DAY ORALLY     montelukast (SINGULAIR) 10 MG tablet Take 10 mg by mouth at bedtime.     Multiple Vitamin (MULTIVITAMIN) capsule Take 1 capsule by mouth daily.     sertraline (ZOLOFT) 50 MG tablet Take 1 tablet by mouth daily.     Fluticasone Furoate (ARNUITY ELLIPTA) 100 MCG/ACT AEPB TAKE 1 PUFF BY MOUTH EVERY DAY (Patient not taking: Reported on 09/03/2022)     sodium chloride (OCEAN) 0.65 % nasal spray Place 1 spray into the nose as needed. allergies  Current Facility-Administered Medications  Medication Dose Route Frequency Provider Last Rate Last Admin   0.9 %  sodium chloride infusion  500 mL Intravenous Once Danis, Andreas Blower, MD          ___________________________________________________________________ Objective   Exam:  BP 126/73   Pulse (!) 59   Temp (!) 97.5 F (36.4 C) (Temporal)   Ht 4\' 11"  (1.499 m)   Wt 134 lb (60.8 kg)   LMP 09/01/2022 (Approximate)   SpO2 100%   BMI 27.06 kg/m   CV: regular , S1/S2 Resp: clear to auscultation bilaterally, normal RR and effort noted GI: soft, no tenderness, with active bowel sounds.   Assessment: Encounter  Diagnosis  Name Primary?   Special screening for malignant neoplasms, colon Yes     Plan: Colonoscopy   The benefits and risks of the planned procedure were described in detail with the patient or (when appropriate) their health care proxy.  Risks were outlined as including, but not limited to, bleeding, infection, perforation, adverse medication reaction leading to cardiac or pulmonary decompensation, pancreatitis (if ERCP).  The limitation of incomplete mucosal visualization was also discussed.  No guarantees or warranties were given.  The patient is appropriate for an endoscopic procedure in the ambulatory setting.   - Alison Jupiter, MD

## 2022-10-02 ENCOUNTER — Telehealth: Payer: Self-pay

## 2022-10-02 NOTE — Telephone Encounter (Signed)
  Follow up Call-     10/01/2022   10:25 AM  Call back number  Post procedure Call Back phone  # [phone]  Permission to leave phone message Yes     Patient questions:  Do you have a fever, pain , or abdominal swelling? No. Pain Score  0 *  Have you tolerated food without any problems? Yes.    Have you been able to return to your normal activities? Yes.    Do you have any questions about your discharge instructions: Diet   No. Medications  No. Follow up visit  No.  Do you have questions or concerns about your Care? No.  Actions: * If pain score is 4 or above: No action needed, pain <4.

## 2022-10-17 ENCOUNTER — Ambulatory Visit
Admission: RE | Admit: 2022-10-17 | Discharge: 2022-10-17 | Disposition: A | Discharge status: Home / Self Care | Payer: BC Managed Care – PPO | Source: Ambulatory Visit | Attending: Physician Assistant | Admitting: Physician Assistant

## 2022-10-17 DIAGNOSIS — Z0000 Encounter for general adult medical examination without abnormal findings: Secondary | ICD-10-CM

## 2022-10-22 ENCOUNTER — Encounter: Payer: Self-pay | Admitting: Physician Assistant

## 2022-10-22 ENCOUNTER — Other Ambulatory Visit: Payer: Self-pay | Admitting: Physician Assistant

## 2022-10-22 DIAGNOSIS — R928 Other abnormal and inconclusive findings on diagnostic imaging of breast: Secondary | ICD-10-CM

## 2022-11-01 ENCOUNTER — Ambulatory Visit
Admission: RE | Admit: 2022-11-01 | Discharge: 2022-11-01 | Disposition: A | Discharge status: Home / Self Care | Payer: BC Managed Care – PPO | Source: Ambulatory Visit | Attending: Physician Assistant | Admitting: Physician Assistant

## 2022-11-01 ENCOUNTER — Other Ambulatory Visit: Payer: Self-pay | Admitting: Physician Assistant

## 2022-11-01 DIAGNOSIS — R928 Other abnormal and inconclusive findings on diagnostic imaging of breast: Secondary | ICD-10-CM

## 2022-11-01 DIAGNOSIS — N6310 Unspecified lump in the right breast, unspecified quadrant: Secondary | ICD-10-CM

## 2022-11-01 HISTORY — PX: BREAST BIOPSY: SHX20

## 2022-11-04 LAB — SURGICAL PATHOLOGY

## 2023-10-06 ENCOUNTER — Other Ambulatory Visit: Payer: Self-pay | Admitting: Physician Assistant

## 2023-10-06 DIAGNOSIS — Z1231 Encounter for screening mammogram for malignant neoplasm of breast: Secondary | ICD-10-CM

## 2023-10-21 ENCOUNTER — Ambulatory Visit
Admission: RE | Admit: 2023-10-21 | Discharge: 2023-10-21 | Disposition: A | Discharge status: Home / Self Care | Source: Ambulatory Visit | Attending: Physician Assistant | Admitting: Physician Assistant

## 2023-10-21 DIAGNOSIS — Z1231 Encounter for screening mammogram for malignant neoplasm of breast: Secondary | ICD-10-CM

## 2023-10-24 IMAGING — MG MM DIGITAL DIAGNOSTIC UNILAT*L* W/ TOMO W/ CAD
6 series · 6 of 18 positions shown · non-contrast
Comparison: Previous exam(s).

CLINICAL DATA: The patient was called back for left breast mass
only seen on the cc view.

EXAM:
DIGITAL DIAGNOSTIC UNILATERAL LEFT MAMMOGRAM WITH TOMOSYNTHESIS AND
CAD; ULTRASOUND LEFT BREAST LIMITED
TECHNIQUE: Left digital diagnostic mammography and breast tomosynthesis was
performed. The images were evaluated with computer-aided detection.;
Targeted ultrasound examination of the left breast was performed.

[L CC synth-2D]
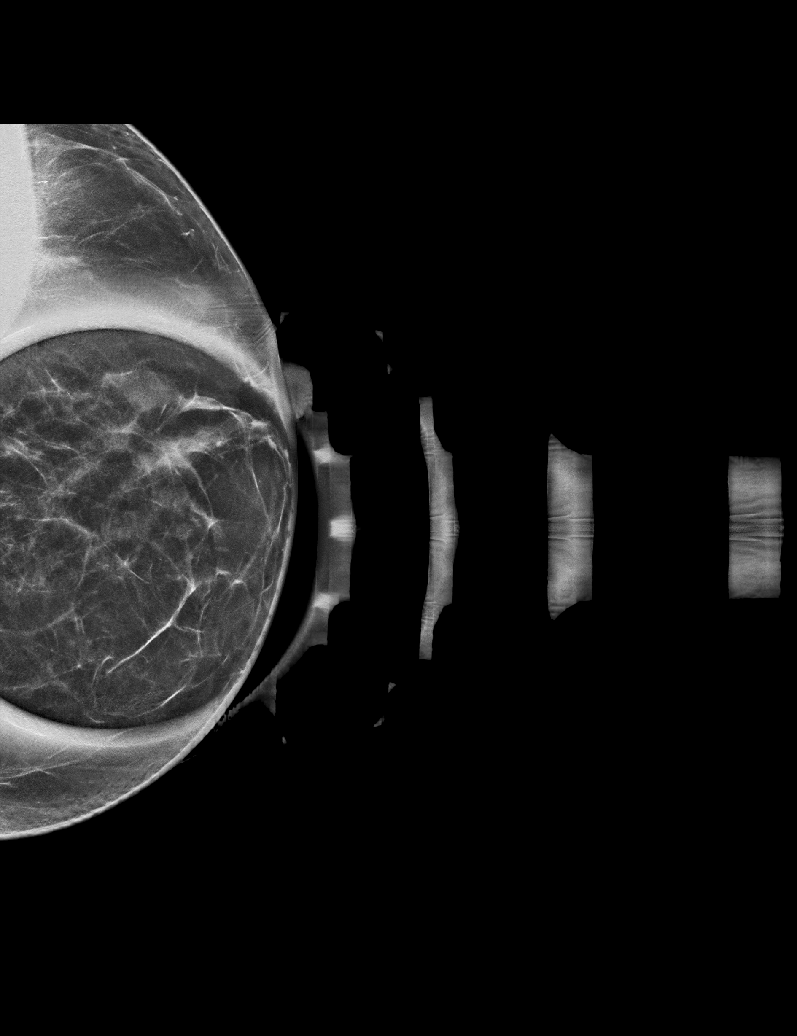

[L ML synth-2D]
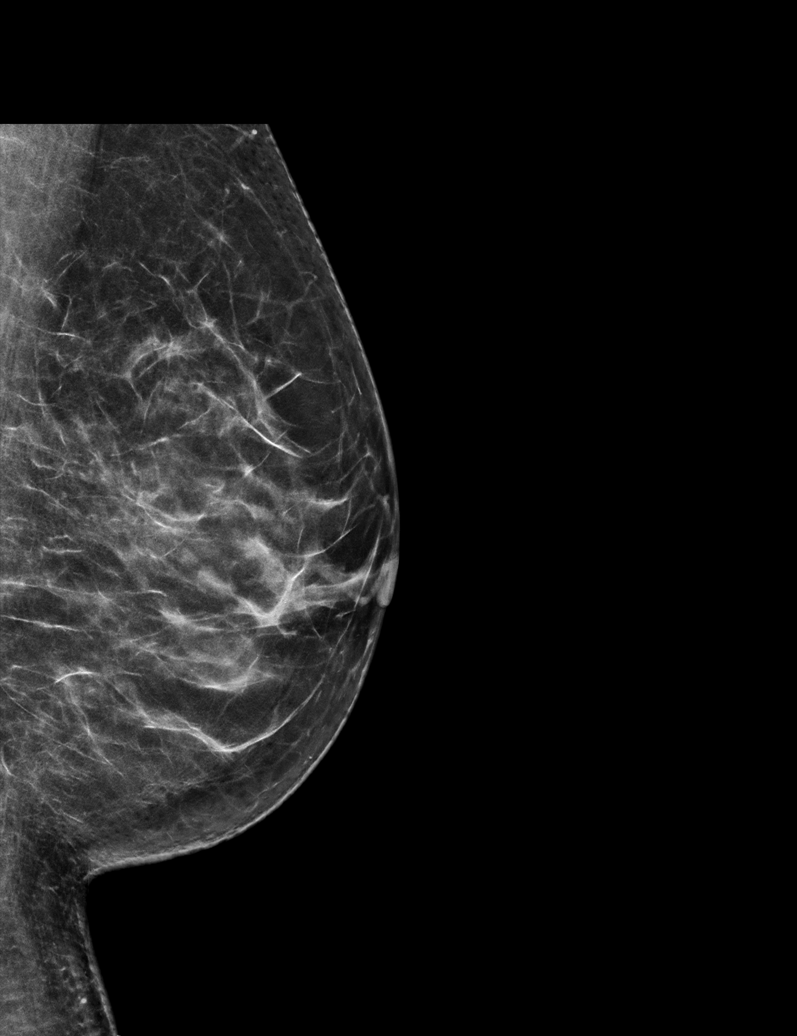

[L MLO synth-2D]
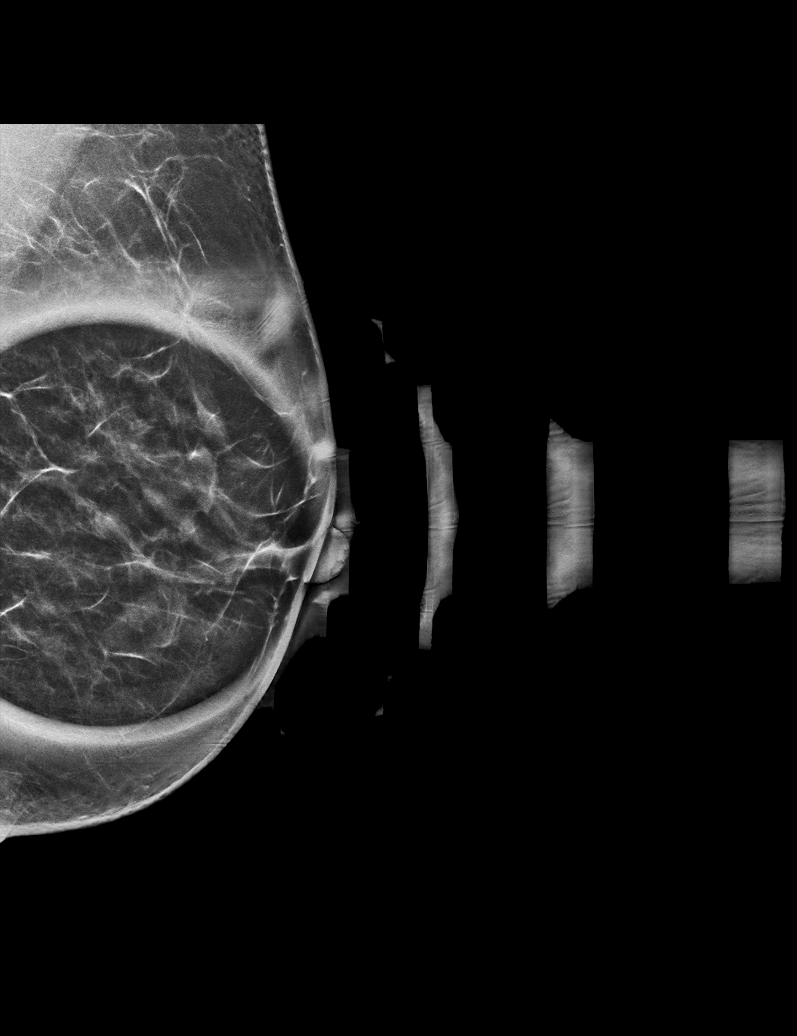

[L CC tomo · tomo slice 25/50.0]
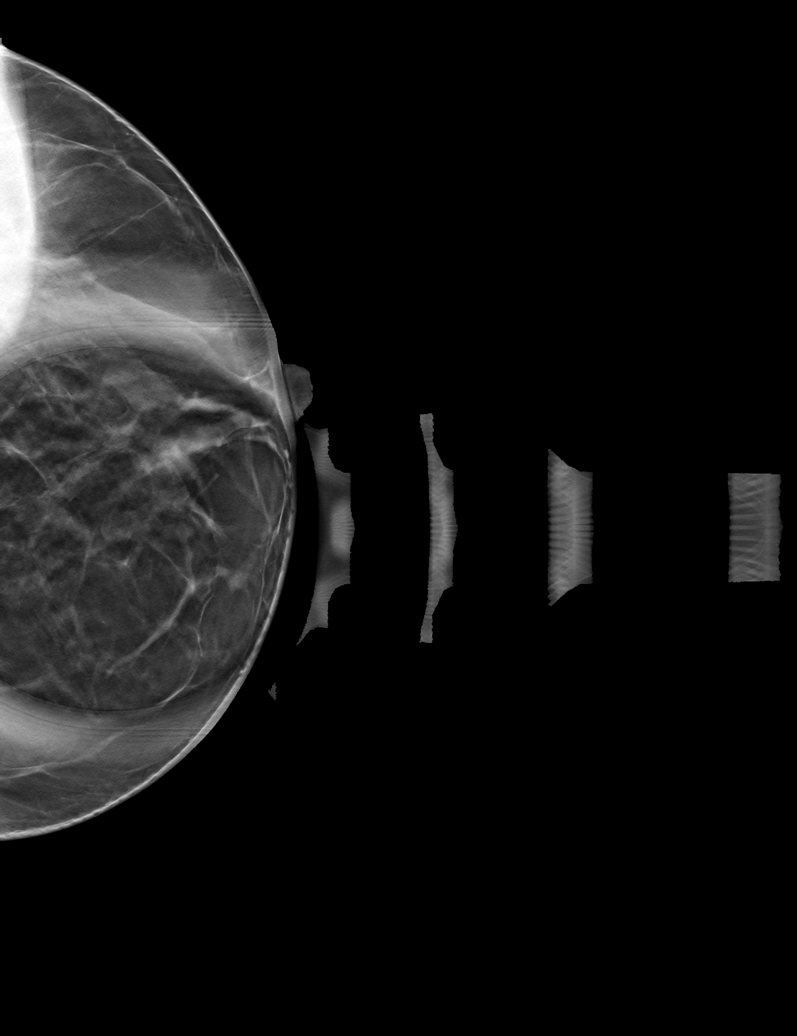

[L ML tomo · tomo slice 31/62.0]
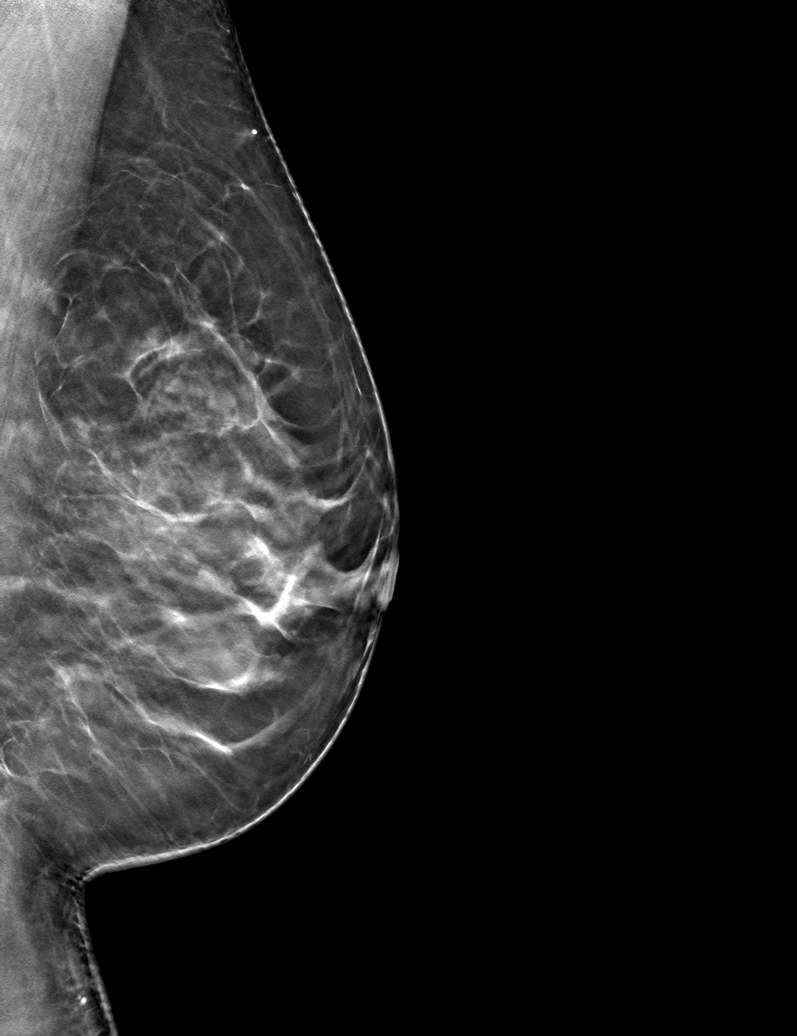

[L MLO tomo · tomo slice 27/53.0]
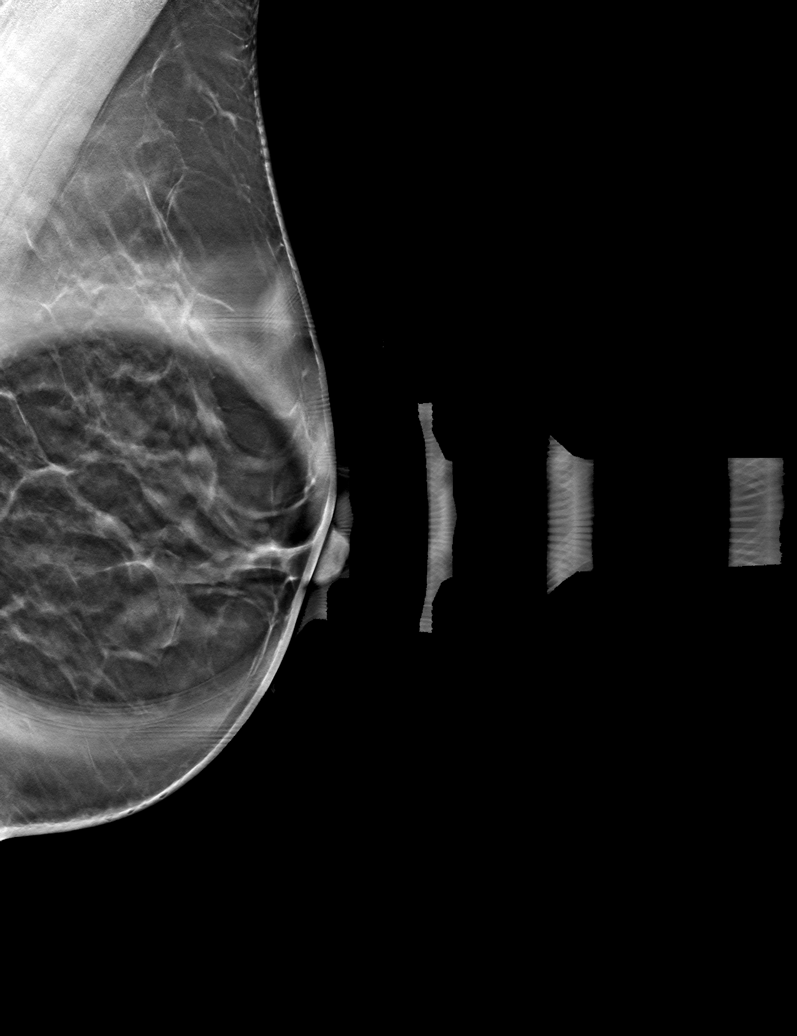

[6 of 18 positions shown; findings below may reference images not displayed]

ACR Breast Density Category b: There are scattered areas of
fibroglandular density.
FINDINGS: The left breast mass persists on the cc view.

Targeted ultrasound is performed, showing a cluster of cysts
accounting for the left breast mass. No evidence of malignancy.
IMPRESSION: The left breast mass is a cluster of cysts of no significance. No
evidence of malignancy.

RECOMMENDATION:
Annual screening mammography.

I have discussed the findings and recommendations with the patient.
If applicable, a reminder letter will be sent to the patient
regarding the next appointment.

BI-RADS CATEGORY  2: Benign.
